# Patient Record
Sex: Female | Born: 1955 | Race: White | Hispanic: No | Marital: Single | Smoking: Never smoker
Health system: Southern US, Community
[De-identification: ages and names within clinical notes are randomized; demographics above are authoritative.]

## PROBLEM LIST (undated history)

## (undated) DIAGNOSIS — Z515 Encounter for palliative care: Secondary | ICD-10-CM

## (undated) DIAGNOSIS — G909 Disorder of the autonomic nervous system, unspecified: Secondary | ICD-10-CM

## (undated) DIAGNOSIS — G20 Parkinson's disease: Secondary | ICD-10-CM

## (undated) DIAGNOSIS — G239 Degenerative disease of basal ganglia, unspecified: Principal | ICD-10-CM

## (undated) DIAGNOSIS — G903 Multi-system degeneration of the autonomic nervous system: Secondary | ICD-10-CM

## (undated) DIAGNOSIS — L8990 Pressure ulcer of unspecified site, unspecified stage: Secondary | ICD-10-CM

## (undated) DIAGNOSIS — R339 Retention of urine, unspecified: Secondary | ICD-10-CM

## (undated) DIAGNOSIS — R0602 Shortness of breath: Secondary | ICD-10-CM

## (undated) DIAGNOSIS — M1990 Unspecified osteoarthritis, unspecified site: Secondary | ICD-10-CM

## (undated) DIAGNOSIS — G238 Other specified degenerative diseases of basal ganglia: Secondary | ICD-10-CM

## (undated) DIAGNOSIS — Z7401 Bed confinement status: Secondary | ICD-10-CM

## (undated) DIAGNOSIS — N319 Neuromuscular dysfunction of bladder, unspecified: Secondary | ICD-10-CM

## (undated) DIAGNOSIS — I951 Orthostatic hypotension: Secondary | ICD-10-CM

## (undated) DIAGNOSIS — R300 Dysuria: Secondary | ICD-10-CM

## (undated) DIAGNOSIS — R32 Unspecified urinary incontinence: Secondary | ICD-10-CM

## (undated) DIAGNOSIS — Z8639 Personal history of other endocrine, nutritional and metabolic disease: Secondary | ICD-10-CM

## (undated) HISTORY — DX: Other specified degenerative diseases of basal ganglia: G23.8

## (undated) HISTORY — PX: TONSILLECTOMY: SUR1361

## (undated) HISTORY — PX: REFRACTIVE SURGERY: SHX103

## (undated) HISTORY — DX: Unspecified osteoarthritis, unspecified site: M19.90

## (undated) HISTORY — DX: Multi-system degeneration of the autonomic nervous system: G90.3

## (undated) HISTORY — DX: Parkinson's disease: G20

## (undated) HISTORY — DX: Degenerative disease of basal ganglia, unspecified: G23.9

## (undated) HISTORY — DX: Neuromuscular dysfunction of bladder, unspecified: N31.9

## (undated) HISTORY — PX: OTHER SURGICAL HISTORY: SHX169

## (undated) HISTORY — PX: NEUROENDOSCOPIC PLACEMENT / REPLACEMENT VENTRICULAR CATHETER W/ ATTACHMENT SHUNT / EXTERNAL DRAIN: SUR890

---

## 2011-10-19 ENCOUNTER — Emergency Department (HOSPITAL_COMMUNITY): Payer: 59

## 2011-10-19 ENCOUNTER — Observation Stay (HOSPITAL_COMMUNITY)
Admission: EM | Admit: 2011-10-19 | Discharge: 2011-10-22 | DRG: 057 | Disposition: A | Discharge status: Home Health | Payer: 59 | Source: Ambulatory Visit | Attending: Internal Medicine | Admitting: Internal Medicine

## 2011-10-19 DIAGNOSIS — R1310 Dysphagia, unspecified: Secondary | ICD-10-CM | POA: Insufficient documentation

## 2011-10-19 DIAGNOSIS — R5383 Other fatigue: Secondary | ICD-10-CM | POA: Insufficient documentation

## 2011-10-19 DIAGNOSIS — G20A1 Parkinson's disease without dyskinesia, without mention of fluctuations: Secondary | ICD-10-CM | POA: Insufficient documentation

## 2011-10-19 DIAGNOSIS — G4700 Insomnia, unspecified: Secondary | ICD-10-CM | POA: Insufficient documentation

## 2011-10-19 DIAGNOSIS — Z982 Presence of cerebrospinal fluid drainage device: Secondary | ICD-10-CM | POA: Insufficient documentation

## 2011-10-19 DIAGNOSIS — I10 Essential (primary) hypertension: Secondary | ICD-10-CM | POA: Insufficient documentation

## 2011-10-19 DIAGNOSIS — G20 Parkinson's disease: Secondary | ICD-10-CM | POA: Insufficient documentation

## 2011-10-19 DIAGNOSIS — R42 Dizziness and giddiness: Secondary | ICD-10-CM | POA: Insufficient documentation

## 2011-10-19 DIAGNOSIS — K589 Irritable bowel syndrome without diarrhea: Secondary | ICD-10-CM | POA: Insufficient documentation

## 2011-10-19 DIAGNOSIS — R5381 Other malaise: Secondary | ICD-10-CM | POA: Insufficient documentation

## 2011-10-19 DIAGNOSIS — G238 Other specified degenerative diseases of basal ganglia: Principal | ICD-10-CM | POA: Insufficient documentation

## 2011-10-19 DIAGNOSIS — R262 Difficulty in walking, not elsewhere classified: Secondary | ICD-10-CM | POA: Insufficient documentation

## 2011-10-19 LAB — URINE MICROSCOPIC-ADD ON

## 2011-10-19 LAB — URINALYSIS, ROUTINE W REFLEX MICROSCOPIC
Bilirubin Urine: NEGATIVE
Leukocytes, UA: NEGATIVE
Nitrite: NEGATIVE
Specific Gravity, Urine: 1.015 (ref 1.005–1.030)
Urobilinogen, UA: 0.2 mg/dL (ref 0.0–1.0)
pH: 7 (ref 5.0–8.0)

## 2011-10-19 LAB — COMPREHENSIVE METABOLIC PANEL
BUN: 26 mg/dL — ABNORMAL HIGH (ref 6–23)
CO2: 30 mEq/L (ref 19–32)
Calcium: 9.5 mg/dL (ref 8.4–10.5)
Chloride: 105 mEq/L (ref 96–112)
Creatinine, Ser: 0.8 mg/dL (ref 0.50–1.10)
GFR calc Af Amer: >90 mL/min (ref >90)
GFR calc non Af Amer: 80 mL/min — ABNORMAL LOW (ref >90)
Glucose, Bld: 130 mg/dL — ABNORMAL HIGH (ref 70–99)
Potassium: 3.9 mEq/L (ref 3.5–5.1)
Sodium: 143 mEq/L (ref 135–145)
Total Protein: 6.5 g/dL (ref 6.0–8.3)

## 2011-10-19 LAB — CBC
HCT: 42.8 % (ref 36.0–46.0)
Platelets: 164 10*3/uL (ref 150–400)
RDW: 12.5 % (ref 11.5–15.5)
WBC: 7.2 10*3/uL (ref 4.0–10.5)

## 2011-10-19 LAB — DIFFERENTIAL
Basophils Absolute: 0 10*3/uL (ref 0.0–0.1)
Eosinophils Absolute: 0.1 10*3/uL (ref 0.0–0.7)
Eosinophils Relative: 2 % (ref 0–5)
Lymphocytes Relative: 17 % (ref 12–46)

## 2011-10-20 ENCOUNTER — Encounter: Payer: Self-pay | Admitting: Internal Medicine

## 2011-10-20 DIAGNOSIS — R269 Unspecified abnormalities of gait and mobility: Secondary | ICD-10-CM

## 2011-10-20 DIAGNOSIS — G238 Other specified degenerative diseases of basal ganglia: Secondary | ICD-10-CM

## 2011-10-20 LAB — URINE CULTURE: Culture  Setup Time: 201210250130

## 2011-10-20 NOTE — H&P (Signed)
Hospital Admission Note Date: 10/20/2011  Patient name: [name] Medical record number: [id number] Date of birth: [birth year] Age: 55 y.o. Gender: female PCP: Recently moved from [place], scheduled to see Dr. Glena Norfolk as PCP; Scheduled to attend Movement Disorder clinic at [facility] in December.  Medical Service: Leitha Bleak Teaching Service  Attending physician: Dr. Cliffton Asters    1st Contact: Dr. Margorie John  Pager: [phone] 2nd Contact: Dr. Johnette Abraham Pager: [phone] After 5 pm or weekends: 1st Contact:      Pager: [phone] 2nd Contact:      Pager: [phone]  Chief Complaint: Difficulty walking, dizziness, confusion  History of Present Illness: 55 yo woman with PMH of Multiple system atrophy that presents with 1-2 days of worsening symptoms of gait instability, confusion & dizziness.  She describes dizziness as both feeling like going to pass out & as feeling out of equilibrium with balance disturbances, and notes that she experiences the latter more frequently.  She notes that her symptoms worsens when she is tired.  She describes confusion as not knowing how to do something she previously knew how to do.  The first occurrence of confusion was a few weeks ago in [place] (from where she recently moved) when she couldn't remember how to change clothes.  During this first effect, she notes having missed a dose of fludrocortisone.  On the day prior to admission, she could not "tell my feet to walk to the bathroom".  She denies visual or auditory hallucinations.  She notes being hypotensive on the morning of admission, but does not know exactly what her pressure was.  Over the last 1-2 days she has had more difficulty with walking, which she describes as increased rigidity, and she uses a walker at baseline.  She reports blurry vision occasionally, but this has been occuring since December.  She reports sinus congestion with dry cough for the last few days.  She has not been around any  sick contacts recently and denies fever/chills.  Finally, she reports worsening dysphagia over the last few months, for which she was to see a Doctor, general practice before moving to [place].  She notes that she has difficulty swalling her glucosamine tablet, so has discontinued this medication.  She reports that bread/crackers gets stuck in her throat, which she is able to wash down with fluid.  She reports that she underwent a barium study in [place] 6 weeks ago that suggested that her swallowing reflex is not timed appropriately, and she can swallow more effectively when she bends her neck.   Meds: Carbidopa-Levodopa Fludrocortisone, one dose every other day Fosamax Lorazepam (pt reports she has this medication, but does not take)   Allergies: Ibuprofen (facial swelling), Sulfa (unknown rxn, happened when she was a baby)  Past Medical History : Multiple System Atrophy (Shy-Drager Syndrome), Osteoperosis, arthritis (b/l knees) Briefly, her neurologically related problems began about 3 years ago, as she began having problems with her blood pressure, IBS, & urinary incontinence.  About 1.5 years ago, her symptoms worsened and she was placed on fludrocortisone for hypotension.  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  The cyst was removed, her symptoms briefly improved, and then worsened again.  Fluid accumulation was found in the empty space from cyst removal, and she then had a shunt placed in May 2012.  Symptoms again improved then worsened.  She was recently diagnosed with Multiple System  Atrophy in [place], [place] by Dr. Rhetta Mura.  She was also being managed by cardiology (Dr. Westley Foots, [facility], [place]).  Past Surgical History: Brain cyst removal (December 2011), Tonsillectomy, Lasix Eye surgery  Family History:  Father - HIV, HBV, CAD, died after 5-vessel bypass surgery @ 65 yo Mother  - CAD, died of MI @ 71 yo Brother - HLD  Social History  . Marital Status: Single   Occupational History  . 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 Smoking status: None  . Alcohol Use: Occasional wine  . Drug Use: None   Other Topics Concern  . Moved from [place] 1 week ago (10/11/11) to live with brother given worsening health   Review of Systems: General: no fevers, chills, changes in weight, changes in appetite Skin: no rash HEENT: no hearing changes, sore throat Pulm: no dyspnea, wheezing CV: no chest pain, palpitations, shortness of breath Abd: no abdominal pain, nausea/vomiting, +constipation (patient has history of IBS 2/2 MSA) GU: no dysuria, hematuria, polyuria, + urinary incontinence (urinates when she rises from sitting) Ext: no new arthralgias, myalgias Neuro: no weakness, numbness, or tingling  Physical Exam: Vitals: T:98.6   R:16    BP: 181/106    RR: 16    O2 saturation: 100% RA General: resting in bed, no acute distress HEENT: PERRL, EOMI, no scleral icterus, no conjunctival pallor Cardiac: RRR, no rubs, murmurs or gallops, normal S1, S2 Pulm: clear to auscultation bilaterally, moving normal volumes of air, no wheezing/rales/rhonchi Abd: soft, nontender, nondistended, BS normoactive Ext: warm and well perfused, no pedal edema, bradykineseic rigidity of all 4 limps Neuro: alert and oriented X3, cranial nerves II-XII grossly intact, strength (3/5 in all extremities) and sensation to light touch equal in bilateral upper and lower extremities, unable to assess gait as patient think she cannot walk today, but may be able to after some more rest  Lab results: Basic Metabolic Panel:  Basename 10/19/11 1739  NA 143  K 3.9  CL 105  CO2 30  GLUCOSE 130*  BUN 26*  CREATININE 0.80  CALCIUM 9.5  MG --  PHOS --   Liver Function Tests:  Basename 10/19/11 1739  AST 17  ALT 8  ALKPHOS 62  BILITOT 0.7    PROT 6.5  ALBUMIN 4.2   CBC:  Basename 10/19/11 1739  WBC 7.2  NEUTROABS 5.3  HGB 14.3  HCT 42.8  MCV 89.9  PLT 164   Imaging results:  10/19/2011  CHEST - 2 VIEW:  Comparison: None.   Findings: Cardiac silhouette normal in size.  Thoracic aorta minimally atherosclerotic.  Hilar and mediastinal contours otherwise.  Mild hyperinflation.  Mildly prominent bronchovascular markings diffusely and mild central peribronchial thickening. Lungs otherwise clear.  No pleural effusions.  Visualized bony thorax intact.  Apparent nodular opacities in the lung bases on the PA image represent the nipples.   IMPRESSION: Mild changes of bronchitis and/or asthma which may be acute or chronic.  No acute cardiopulmonary disease otherwise.     Assessment & Plan by Problem: Patient is a 55 yo woman with PMH significant for MSA that presents with  #Unsteady gait: Patient has had extensive work up in the past, and in the last month has been diagnosed with Multiple System Atrophy, which is characterized by autonomic dysfunction, parkinsonism, & ataxia. As the disease progresses, autonomic symptoms that patients may develop include orthostatic hypotension, urinary incontinence, constipation, vocal cord  paralysis, dry mouth & skin, trouble regulating body temperature, abnormal breathing & sleep disorders.  Ms. Tortora seems to already be suffering with several of these symptoms.  I suspect her baseline symptoms are being exacerbated by exhaustion from her recent move to  as well as possibly a mild sinus infection or allergies.  Given her history of neurosurgery, the possibility that some sort of mass effect may be contributing to symptoms cannot be ruled out. Plan:  -Continue Carbidopa-Levodopa once dose clarified -Follow up with Neurology recommendations  -Follow up MRI results -Retreive records from [place] -PT/OT consult  #Hypertension : Patient reports labile pressures, many times being hypotensive.  Likely  2/2 above reason. Plan: -Hold fludrocortisone until dose clarified, also to monitor BP -Orthostatic Vital signs  #Dysphagia: Problem has been persisting for the last few months and has been worked up in [place].  Likely 2/2 first problem. Plan: -Consult speech therapy -Obtain medical records for results from barium speech swallow in [place]  #VTE Prophylaxis: Lovenox 40mg  Watkins daily  R3______________________________ (Dr. Nelda Bucks, [phone])   R1________________________________ (Dr. Vernice Jefferson, [phone])  ATTENDING: I performed and/or observed a history and physical examination of the patient.  I discussed the case with the residents as noted and reviewed the residents' notes.  I agree with the findings and plan--please refer to the attending physician note for more details.  Signature________________________________  Printed Name_____________________________

## 2011-10-21 ENCOUNTER — Inpatient Hospital Stay (HOSPITAL_COMMUNITY): Payer: 59

## 2011-10-22 DIAGNOSIS — G238 Other specified degenerative diseases of basal ganglia: Secondary | ICD-10-CM

## 2011-10-22 DIAGNOSIS — R269 Unspecified abnormalities of gait and mobility: Secondary | ICD-10-CM

## 2011-10-26 NOTE — Consult Note (Signed)
[name], [name]                  ACCOUNT NO.:  [id number]  MEDICAL RECORD NO.:  [id number]  LOCATION:  [facility]                         FACILITY:  [facility]  PHYSICIAN:  Joycelyn Schmid, MD   DATE OF BIRTH:  1956/10/24  DATE OF CONSULTATION:  10/20/2011 DATE OF DISCHARGE:                                CONSULTATION   CHIEF COMPLAINT:  Gait difficulty, freezing, low blood pressure.  HISTORY OF PRESENT ILLNESS:  A 55 year old female with recent diagnosis of multiple system atrophy, type A, who is living in [place], moved to [place] 1 week ago.  She lives with her brother, has been having at least 1 month progressive decline in mobility, gait, balance, and progressive dizziness.  The patient came to the hospital due to inability to ambulate at home and extreme dizziness.  Upon arrival, the patient was found to have hypotension with systolic blood pressure in the 50s which responded to IV fluids and positioning.  The patient's symptoms date back to at least 2010, when she initially had irritable bowel syndrome, incontinence, and intermittent episodes of low blood pressure.  Ultimately, she was found to have a posterior fossa cyst which was operated on in December 2011.  Subsequently, the patient developed a membrane and this was operated on again and intracranial shunt was placed in May 2012.  Her symptoms failed to improve. Ultimately, she saw a neurologist in [place] who diagnosed her with Parkinson-plus syndrome, multiple system atrophy type A.  She was started on carbidopa/ levodopa and fludrocortisone.  MEDICATIONS: 1. Carbidopa/levodopa 1 tablet 3 times a day, she is not sure of the     milligram dose. 2. Fludrocortisone 0.1 mg every other day. 3. Fosamax. 4. Aspirin 81 mg daily.  ALLERGIES:  IBUPROFEN and SULFA.  PAST MEDICAL HISTORY:  As per the HPI for Parkinson plus syndrome and osteopenia.  FAMILY HISTORY:  Paternal grand mother's family has number of  family members with Parkinson disease.  SOCIAL HISTORY:  She just moved to [place] to live with her brother.  She has appointment scheduled with [facility] in December.  She has occasional tobacco and alcohol use.  No illicit drug use.  REVIEW OF SYSTEMS:  As per the HPI.  Noted for dizziness, gait difficulty, freezing.  No chest pain, shortness of breath, muscle pains, nausea, vomiting.  She does have irritable bowel syndrome symptoms of incontinence.  Otherwise, review of systems are negative.  PHYSICAL EXAMINATION:  VITAL SIGNS:  Temperature 98.6, initial blood pressure 50/40, then 181/106, and then 91/40, heart rate 80s, respirations 16, O2 saturation 100% on room air. GENERAL:  She is awake and alert.  Language is fluent.  Comprehension intact.  She has a flat affect with hypomania.  She has a positive Myerson sign. NEUROLOGIC:  Cranial nerve examination:  Pupils reactive from 2 to 1 mm. Extraocular muscles intact.  Visual fields full to confrontation. Facial sensation and strength are symmetric.  Uvula is midline. Shoulder is symmetric.  Tongue is midline.  Motor examination: Increased tone in the bilateral upper and lower extremities.  Marked bradykinesia in the bilateral upper and lower extremities.  No resting tremor.  No postural tremor.  Otherwise, she has full strength.  Sensory examination:  Intact to light touch, temperature, vibration.  Reflexes: 2+ in the upper and lower extremities.  Downgoing toes.  Coordination testing:  Finger-nose-finger is smooth, but slow.  CARDIOVASCULAR: Regular rate and rhythm.  No murmurs.  No carotid bruits.  LAB TESTING:  Sodium 143, potassium 3.9, BUN 26, creatinine 0.8, glucose 130.  LFTs normal.  White count 7.2, platelets 164.  UA is negative for UTI.  MRI scan of the brain and cervical spine which I reviewed shows no acute findings.  Postsurgical changes noted in the posterior fossa  from prior cyst resection, posterior fossa shunt also noted.  MRI of the cervical spine shows multilevel degenerative disk disease.  ASSESSMENT AND PLAN:  A 55 year old female with multiple system atrophy type A, now with progression of symptoms.  RECOMMENDATIONS: 1. Gradually increase carbidopa/levodopa dosing to 4 tablets per day     for several days, then 5 tablets per day, and then 6 tablets per     day, goal dosing should be 2 tablets 3 times a day.  For example,     the patient can start taking 2 tablets in the morning, 1 at noon,     and 1 in the evening for 3-5 days, and then increase to 2 in the     morning, 2 at noon, 1 at night for 3-5 days, and then 2 tablets 3     times a day. 2. Continue fludrocortisone.  May need to monitor and make adjustments     depending on her vital signs and her orthostatics tolerance. 3. Physical Therapy evaluation.     Joycelyn Schmid, MD     VP/MEDQ  D:  10/20/2011  T:  10/20/2011  Job:  [id number]  Electronically Signed by Joycelyn Schmid  on 10/26/2011 09:53:28 AM

## 2011-11-02 NOTE — Discharge Summary (Signed)
[name], [name]                  ACCOUNT NO.:  [id number]  MEDICAL RECORD NO.:  [id number]  LOCATION:  [facility]                         FACILITY:  [facility]  PHYSICIAN:  Cliffton Asters, M.D.    DATE OF BIRTH:  1956/09/02  DATE OF ADMISSION:  10/19/2011 DATE OF DISCHARGE:  10/22/2011                              DISCHARGE SUMMARY   DISCHARGE DIAGNOSES: 1. Bradykinesia and rigidity.  The patient has a diagnosis of     Multiple system atrophy which is a Parkinson plus syndrome.  She is     treated at home with Sinemet.  She reports mild improvement in her     rigidity and the strength of her voice with start of Sinemet prior     To admission. 2. Dizziness.  The patient notes dizziness and unsteadiness that have     been a chronic problem for her that are worsened recently.  She is     Managed with fludrocortisone for her orthostatic hypotension.  Her dose     of fludrocortisone had recently been changed by her cardiologist in     [place] so that she was getting it every other day instead of each     day.  It is possible that this contributed to the worsening of her     dizziness. 3. Hypertension.  The patient has been noted to have hypertension     while lying down in the past.  During her hospital stay, she was     not hypertensive. 4. Dysphagia.  The patient reports having history of feeling     subjectively like she is choking on foods at times in the past.      This has been worked up with a barium swallow study in [place]. 5. Insomnia.  The patient reports that she often wakes up as many as 4-     7 times per night to go to the bathroom to urinate. Each trip to     the bathroom takes her more than half an hour, so she has a     significant disturbance in her sleep. 6. Irritable bowel syndrome.  The patient has alternating episodes of          constipation and diarrhea chronically.  She manages this with loperamide and     MiraLax as needed.  DISCHARGE MEDICATIONS: 1. Sinemet 25  mg/100 mg special instructions, please take 2 pills in     the morning, 1 pill at noon and one pill in the evening for 4 days,     then after those 4 days, take 2 pills in the morning, 2 pills at     noon and 1 pill in the evening for 5 days, then continue to take 2     tablets 3 times a day, morning, noon and evening times by mouth. 2. Alendronate 70 mg one tablet by mouth weekly. 3. Aspirin 81 mg one tablet by mouth daily. 4. Fish oil one tablet by mouth daily. 5. Fludrocortisone 0.1 mg one tablet by mouth every other day. 6. Imodium ultra one tablet by mouth every 4 hours as needed for     diarrhea. 7.  Lorazepam 0.5 mg one tablet by mouth daily as needed. 8. Multivitamin one tablet by mouth daily. 9. Calcium carbonate one tablet by mouth daily. 10.Probiotic one tablet by mouth daily.  DISPOSITION AND FOLLOWUP:  The patient has appointment at the [facility] on December 08, 2011, at 8 a.m.  at this time, please evaluate the patient's rigidity and bradykinesia and adjust her dose of Sinemet as you see appropriate.  She also has appointment with Dr. Anda Latina on November 08, 2011, at 2 p.m.  Please continue to manage the patient's hypertension while sitting or laying down and hypotension while standing. This has been an issue for the patient which requires a balance of her dose of fludrocortisone. She also has an appointment with Dr. Sandria Manly, her neurologist.  The office is to call the patient to make an appointment. We would appreciate any recommendations you have in treatment of her ongoing multisystem atrophy.  PROCEDURES PERFORMED:  Chest x-ray, PA and lateral which was read as mild changes of bronchitis and/or asthma which may be acute or chronic. No acute cardiopulmonary disease, otherwise an MRI of the cervical spine without contrast which was read as no acute infarct and an MRI of the brain without contrast which was also read as no acute infarct.  CONSULTATIONS:   Physical therapy, Occupational therapy, Neurology, Speech therapy.  BRIEF HISTORY AND PHYSICAL:  [name] is a 55 year old woman with a past medical history of multisystem atrophy who presents with 1-2 days of worsening symptoms of gait instability, confusion, and dizziness.  She notes her symptoms worsen when she is tired.  She also notes confusion which is described as not knowing how to do something she previously  knew how to do. On the day prior to admission, she could not get her feet to move her towards the bathroom and she also has had more difficulty with walking for 1-2 days which she describes as increased rigidity.  She does use a walker at baseline in the home and a wheelchair when traveling out of the home.  She also reports worsening dysphagia over the last few months and she has noted some difficulty swallowing her glucosamine tablet and has had bread and crackers get suck in her throat occasionally which she is able to wash down with fluid.  She does report having undergone a barium swallow study in [place] few weeks ago that suggested that her swallowing reflux is not appropriately timed and that she can swallow more effectively when she bends her neck while swallowing.  PHYSICAL EXAMINATION:  VITAL SIGNS:  Temperature 98.6, heart rate 80, blood pressure 181/106, respiratory rate 16, and oxygen saturation 100% on room air. GENERAL:  She was a pleasant woman, resting in bed, in no acute distress. HEENT:  Her pupils were equal and reactive to light and her extraocular muscles were intact.  No scleral icterus.  No conjunctival pallor. CARDIAC:  Regular rate and rhythm.  No rubs, murmurs, or gallops. Normal S1, S2. PULMONARY:  Clear to auscultation bilaterally.  Moving normal volumes of air.  No wheezing, rales, or rhonchi. ABDOMEN:  Soft, nontender, nondistended.  Bowel sounds normoactive. EXTREMITIES:  Warm and well perfused.  No pedal edema.  There  was bradykinesia and rigidity of all 4 limbs. NEUROLOGICAL:  Alert and oriented x3.  Cranial nerves II through XII grossly intact.  Strength is 3/5 in all extremities and sensation to light touch is equal in bilateral upper and lower extremities.  Unable to assess gait as  the patient thinks she cannot walk currently.  LAB RESULTS:  Upon admission, CBC, white blood cell 7.2, hemoglobin 14.3, hematocrit 42.8, and platelets 164.  Basic metabolic panel, sodium 143, potassium 3.9, chloride 105, bicarb 30, BUN 26, creatinine 0.8, calcium 9.5, and glucose 130.  HOSPITAL COURSE BY PROBLEM: 1. Bradykinesia and rigidity.  The patient's Sinemet was increased to     4 times a day and she was put on a discharge schedule to increase     the medicine to a total of 6 times a day over the course of 9 days.     Her rigidity improved subjectively, so that she was able to go to     the bathroom by herself.   DICTATION ENDED AT THIS POINT.    ______________________________ Margorie John, MD   ______________________________ Cliffton Asters, M.D.    ST/MEDQ  D:  11/01/2011  T:  11/01/2011  Job:  [id number]  cc:   [facility] Dr. Gwen Pounds. Genene Churn. Love, M.D.

## 2011-11-02 NOTE — Discharge Summary (Signed)
[name], [name]                  ACCOUNT NO.:  [id number]  MEDICAL RECORD NO.:  [id number]  LOCATION:  [facility]                         FACILITY:  [facility]  PHYSICIAN:  Margorie John, MD       DATE OF BIRTH:  Aug 31, 1956  DATE OF ADMISSION:  10/19/2011 DATE OF DISCHARGE:                              DISCHARGE SUMMARY   ADDENDUM  HOSPITAL COURSE BY PROBLEM: 1. Bradykinesia/rigidity.  The patient's dose of Sinemet was increased to     4 times a day.  She reports subjective improvement in her rigidity     during her hospital stay.  This was possibly due to getting more     rest since she has reported that her symptoms of rigidity are worse     when she is feeling tired.  She said she was improved to the point     where she was able to make a trip back and forth to the bathroom,     which was an improvement from her initially needing to use the     bedpan.  She felt essentially back at her baseline. 2. Dizziness.  The patient reports that her dizziness is much     improved.  We did not make any changes to her fludrocortisone     dosing, and she had this improvement without any changes in her     dosing.  Her blood pressure remain well controlled and she did not     become hypertensive. 3. Blood pressure instability.  The patient did not have any episodes     of hypotension during her hospital stay while standing up nor did she have     hypertension while sitting or laying down. 4. Insomnia.  The patient reported no trouble falling asleep in the     hospital and woke only one time overnight to go to the bathroom     which is improvement from her baseline.  She reports that when she     is on higher dose of fludrocortisone, she finds that when she lays     down, she has more retained fluid which makes her take more trips to     the bathroom to void. 5. Irritable bowel syndrome.  The patient did not report having any     episodes of constipation nor diarrhea while in-house. We did not  make any changes to her regimen of MiraLax and Imodium as needed     for constipation or diarrhea.  DISCHARGE LABS AND VITAL SIGNS:  No labs were drawn on the day of discharge.  Vital signs, temperature 97.7, pulse 70, systolic blood pressure 134, diastolic blood pressure 90, respirations 18 per minute, and oxygen saturation 96% on room air.          ______________________________ Margorie John, MD     ST/MEDQ  D:  11/01/2011  T:  11/01/2011  Job:  [id number]

## 2011-11-04 ENCOUNTER — Telehealth: Payer: Self-pay | Admitting: *Deleted

## 2011-11-04 NOTE — Telephone Encounter (Signed)
I have no knowledge of this patien;t who is PCP??

## 2011-11-04 NOTE — Telephone Encounter (Signed)
Pt went home from hospital with orthostatic hypotension.  Lying 94/74, sitting 100/74, a standing with  no reading, which is not unusual.  Asymptomatic and using 02 and PT.  Any other orders?

## 2011-11-07 NOTE — Telephone Encounter (Signed)
Talked to [facility], and they do have this pt, but are confused on where she came from.  Will call us back.

## 2011-11-08 ENCOUNTER — Ambulatory Visit (INDEPENDENT_AMBULATORY_CARE_PROVIDER_SITE_OTHER): Payer: 59 | Admitting: Internal Medicine

## 2011-11-08 ENCOUNTER — Encounter: Payer: Self-pay | Admitting: Internal Medicine

## 2011-11-08 DIAGNOSIS — M810 Age-related osteoporosis without current pathological fracture: Secondary | ICD-10-CM | POA: Insufficient documentation

## 2011-11-08 DIAGNOSIS — G238 Other specified degenerative diseases of basal ganglia: Secondary | ICD-10-CM

## 2011-11-08 NOTE — Patient Instructions (Signed)
Return in one month for follow-up  Neurology followup as discussed

## 2011-11-08 NOTE — Progress Notes (Signed)
Subjective:    Patient ID: [name], female    DOB: Mar 24, 1956, 55 y.o.   MRN: [id number]  HPI  55 year old patient who is seen today to establish with our practice. 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 She is now followed at the [facility] neurology outpatient movement disorder clinic. Her chief problem is orthostatic hypotension at the present time she is on Florinef 0.1 mg every other day as well as midodrine 5 mg twice daily. She continues to have a labile readings. She has been given detailed instructions for nondrug treatment of low blood pressure on standing from the [facility] neurology department. She has been followed by Dr. Rubin Payor who plans on following the patient only by annually. The patient wishes to followup also with a local neurologist.  She was hospitalized recently due to the worsening symptoms of gait instability confusion and orthostatic dizziness. At the present time she feels reasonably stable. She is ambulatory within the home with a walker.       Review of Systems  Constitutional: Negative for fever, appetite change, fatigue and unexpected weight change.  HENT: Negative for hearing loss, ear pain, nosebleeds, congestion, sore throat, mouth sores, trouble swallowing, neck stiffness, dental problem, voice change, sinus pressure and tinnitus.   Eyes: Negative for photophobia, pain, redness and visual disturbance.  Respiratory: Negative for cough, chest tightness and shortness of breath.   Cardiovascular: Negative for chest pain, palpitations and leg swelling.  Gastrointestinal: Negative for nausea, vomiting, abdominal pain, diarrhea, constipation, blood in stool, abdominal distention and rectal pain.  Genitourinary: Positive for difficulty urinating (urinary incontinence). Negative for dysuria, urgency, frequency, hematuria, flank pain, vaginal bleeding, vaginal discharge,  genital sores, vaginal pain, menstrual problem and pelvic pain.  Musculoskeletal: Negative for back pain and arthralgias.  Skin: Negative for rash.  Neurological: Positive for dizziness, syncope, weakness and light-headedness. Negative for speech difficulty, numbness and headaches.  Hematological: Negative for adenopathy. Does not bruise/bleed easily.  Psychiatric/Behavioral: Negative for suicidal ideas, behavioral problems, self-injury, dysphoric mood and agitation. The patient is not nervous/anxious.        Objective:   Physical Exam  Constitutional: She is oriented to person, place, and time. She appears well-developed and well-nourished.       Blood pressure 102/68 on arrival sitting Blood pressure repeat 94/64 sitting  Standing blood pressure not attempted  HENT:  Head: Normocephalic.  Right Ear: External ear normal.  Left Ear: External ear normal.  Mouth/Throat: Oropharynx is clear and moist.  Eyes: Conjunctivae and EOM are normal. Pupils are equal, round, and reactive to light.  Neck: Normal range of motion. Neck supple. No thyromegaly present.  Cardiovascular: Normal rate, regular rhythm, normal heart sounds and intact distal pulses.   Pulmonary/Chest: Effort normal and breath sounds normal.  Abdominal: Soft. Bowel sounds are normal. She exhibits no mass. There is no tenderness.  Musculoskeletal: Normal range of motion.       Support hose in place  Lymphadenopathy:    She has no cervical adenopathy.  Neurological: She is alert and oriented to person, place, and time.  Skin: Skin is warm and dry. No rash noted.  Psychiatric: She has a normal mood and affect. Her behavior is normal.          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 She goes to avoid symptomatic orthostatic hypotension.  Patient is aware that blood pressure readings that time in a supine position may run  high. Her nondrug treatment for low blood pressure on standing review. She will followup at the [facility] and also be set up to see a local neurologist. Hospital records will be reviewed Return next month for followup

## 2011-11-11 ENCOUNTER — Telehealth: Payer: Self-pay | Admitting: Internal Medicine

## 2011-11-11 NOTE — Telephone Encounter (Signed)
ok 

## 2011-11-11 NOTE — Telephone Encounter (Signed)
Pt is a new DR[facility] Bev[facility] Ngo from [facility] would like her to keep up with her physical therapy once a week for the next 5 weeks. Any question please contact Olegario Messier

## 2011-11-11 NOTE — Telephone Encounter (Signed)
Kim from [facility] would like additional occupational  therapy sessions for this pt, once  a wk for next 2 wk. Please call back with verbal orders.

## 2011-11-14 NOTE — Telephone Encounter (Signed)
Attempt to call - VM - LMTCB if questions - ok to continue PT

## 2011-11-14 NOTE — Telephone Encounter (Signed)
[name] is aware occupational therapy has been approved

## 2011-12-08 ENCOUNTER — Encounter: Payer: Self-pay | Admitting: Internal Medicine

## 2011-12-08 ENCOUNTER — Ambulatory Visit (INDEPENDENT_AMBULATORY_CARE_PROVIDER_SITE_OTHER): Payer: 59 | Admitting: Internal Medicine

## 2011-12-08 DIAGNOSIS — G238 Other specified degenerative diseases of basal ganglia: Secondary | ICD-10-CM

## 2011-12-08 DIAGNOSIS — R350 Frequency of micturition: Secondary | ICD-10-CM

## 2011-12-08 DIAGNOSIS — M810 Age-related osteoporosis without current pathological fracture: Secondary | ICD-10-CM

## 2011-12-08 LAB — POCT URINALYSIS DIPSTICK
Bilirubin, UA: NEGATIVE
Leukocytes, UA: NEGATIVE
Nitrite, UA: NEGATIVE
pH, UA: 6

## 2011-12-08 MED ORDER — FLUDROCORTISONE ACETATE 0.1 MG PO TABS: ORAL_TABLET | ORAL | Status: DC

## 2011-12-08 NOTE — Patient Instructions (Signed)
Increase Florinef to daily Liberalize  Your  fluid and salt intake  Return in 3 months

## 2011-12-08 NOTE — Progress Notes (Signed)
  Subjective:    Patient ID: [name], female    DOB: 1956-10-02, 55 y.o.   MRN: [id number]  HPI  55 year old patient who has a history of multisystem atrophy/parkinsonism plus syndrome with significant orthostatic hypotension. She remains fairly symptomatic with the hypotension but has had no frank syncope. There has been only modest peripheral edema and very little supine hypertension. In general doing quite well    Review of Systems  Constitutional: Positive for fatigue.  HENT: Negative for hearing loss, congestion, sore throat, rhinorrhea, dental problem, sinus pressure and tinnitus.   Eyes: Negative for pain, discharge and visual disturbance.  Respiratory: Negative for cough and shortness of breath.   Cardiovascular: Negative for chest pain, palpitations and leg swelling.  Gastrointestinal: Negative for nausea, vomiting, abdominal pain, diarrhea, constipation, blood in stool and abdominal distention.  Genitourinary: Negative for dysuria, urgency, frequency, hematuria, flank pain, vaginal bleeding, vaginal discharge, difficulty urinating, vaginal pain and pelvic pain.  Musculoskeletal: Negative for joint swelling, arthralgias and gait problem.  Skin: Negative for rash.  Neurological: Positive for weakness and light-headedness. Negative for dizziness, syncope, speech difficulty, numbness and headaches.  Hematological: Negative for adenopathy.  Psychiatric/Behavioral: Negative for behavioral problems, dysphoric mood and agitation. The patient is not nervous/anxious.        Objective:   Physical Exam  Constitutional: She is oriented to person, place, and time. She appears well-developed and well-nourished.       Blood pressure sitting 80/60  HENT:  Head: Normocephalic.  Right Ear: External ear normal.  Left Ear: External ear normal.  Mouth/Throat: Oropharynx is clear and moist.  Eyes: Conjunctivae and EOM are normal. Pupils are equal, round, and reactive to light.  Neck: Normal  range of motion. Neck supple. No thyromegaly present.  Cardiovascular: Normal rate, regular rhythm, normal heart sounds and intact distal pulses.   Pulmonary/Chest: Effort normal and breath sounds normal.  Abdominal: Soft. Bowel sounds are normal. She exhibits no mass. There is no tenderness.  Musculoskeletal: Normal range of motion.  Lymphadenopathy:    She has no cervical adenopathy.  Neurological: She is alert and oriented to person, place, and time.  Skin: Skin is warm and dry. No rash noted.  Psychiatric: She has a normal mood and affect. Her behavior is normal.          Assessment & Plan:   Multisystem atrophy with orthostatic hypotension. We'll uptitrate Florinef to 0.1 mg daily Medical regimen otherwise unchanged Recheck 3 months Followup [facility] in the spring

## 2011-12-30 ENCOUNTER — Telehealth: Payer: Self-pay | Admitting: Internal Medicine

## 2011-12-30 MED ORDER — ALENDRONATE SODIUM 70 MG PO TABS: 70.0000 mg | ORAL_TABLET | ORAL | Status: DC

## 2011-12-30 NOTE — Telephone Encounter (Signed)
Generic 70 mg  # 12  rf 6

## 2011-12-30 NOTE — Telephone Encounter (Signed)
Needs new rx for Fosamax sent to [facility]. Was previously filled by another md. Thanks.

## 2011-12-30 NOTE — Telephone Encounter (Signed)
rx sent to pharmacy

## 2012-03-08 ENCOUNTER — Encounter: Payer: Self-pay | Admitting: Internal Medicine

## 2012-03-08 ENCOUNTER — Ambulatory Visit (INDEPENDENT_AMBULATORY_CARE_PROVIDER_SITE_OTHER): Payer: 59 | Admitting: Internal Medicine

## 2012-03-08 VITALS — BP 114/80

## 2012-03-08 DIAGNOSIS — G238 Other specified degenerative diseases of basal ganglia: Secondary | ICD-10-CM

## 2012-03-08 NOTE — Progress Notes (Signed)
  Subjective:    Patient ID: [name], female    DOB: 03/26/1956, 56 y.o.   MRN: [id number]  HPI  56 year old patient who has a history of multi-system atrophy and orthostatic hypotension. She has done fairly well over the past 3 months she spends most of her time in a wheelchair but does have some ability to ambulate with a 4. Walker.  There have been no syncopal episodes or falls since her last visit. Her fewer math has been titrated to a 0.1 mg tablet alternating with one half tablet every other day. She also remains on midodrine    Review of Systems  HENT: Negative for hearing loss, congestion, sore throat, rhinorrhea, dental problem, sinus pressure and tinnitus.   Eyes: Negative for pain, discharge and visual disturbance.  Respiratory: Negative for cough and shortness of breath.   Cardiovascular: Negative for chest pain, palpitations and leg swelling.  Gastrointestinal: Negative for nausea, vomiting, abdominal pain, diarrhea, constipation, blood in stool and abdominal distention.  Genitourinary: Negative for dysuria, urgency, frequency, hematuria, flank pain, vaginal bleeding, vaginal discharge, difficulty urinating, vaginal pain and pelvic pain.  Musculoskeletal: Positive for gait problem. Negative for joint swelling and arthralgias.  Skin: Negative for rash.  Neurological: Positive for weakness. Negative for dizziness, syncope, speech difficulty, numbness and headaches.  Hematological: Negative for adenopathy.  Psychiatric/Behavioral: Negative for behavioral problems, dysphoric mood and agitation. The patient is not nervous/anxious.        Objective:   Physical Exam  Constitutional: She is oriented to person, place, and time. She appears well-developed and well-nourished. No distress.       Alert Wheelchair bound Blood pressure 160/90 sitting 140/80 standing    HENT:  Head: Normocephalic.  Right Ear: External ear normal.  Left Ear: External ear normal.  Mouth/Throat:  Oropharynx is clear and moist.  Eyes: Conjunctivae and EOM are normal. Pupils are equal, round, and reactive to light.  Neck: Normal range of motion. Neck supple. No thyromegaly present.  Cardiovascular: Normal rate, regular rhythm, normal heart sounds and intact distal pulses.   Pulmonary/Chest: Effort normal and breath sounds normal.  Abdominal: Soft. Bowel sounds are normal. She exhibits no mass. There is no tenderness.  Musculoskeletal: Normal range of motion. She exhibits no edema.  Lymphadenopathy:    She has no cervical adenopathy.  Neurological: She is alert and oriented to person, place, and time.  Skin: Skin is warm and dry. No rash noted.  Psychiatric: She has a normal mood and affect. Her behavior is normal.          Assessment & Plan:   Orthostatic hypotension. Fairly well-controlled at the present time we'll continue present regimen Have encouraged more exercise. She has had physical therapy in the past and has been encouraged to Exercise more.  Followup [facility] movement disorder in 2 months Recheck here in 3 or 4 months

## 2012-03-08 NOTE — Patient Instructions (Signed)
Recommend a more regular exercise regimen  Followup at [facility] movement disorder clinic in 2 months  Recheck here in 3-4 months

## 2012-05-21 ENCOUNTER — Emergency Department (HOSPITAL_COMMUNITY): Payer: 59

## 2012-05-21 ENCOUNTER — Emergency Department (HOSPITAL_COMMUNITY)
Admission: EM | Admit: 2012-05-21 | Discharge: 2012-05-21 | Disposition: A | Discharge status: Home / Self Care | Payer: 59 | Attending: Emergency Medicine | Admitting: Emergency Medicine

## 2012-05-21 ENCOUNTER — Encounter (HOSPITAL_COMMUNITY): Payer: Self-pay | Admitting: Emergency Medicine

## 2012-05-21 DIAGNOSIS — W19XXXA Unspecified fall, initial encounter: Secondary | ICD-10-CM

## 2012-05-21 DIAGNOSIS — M810 Age-related osteoporosis without current pathological fracture: Secondary | ICD-10-CM | POA: Insufficient documentation

## 2012-05-21 DIAGNOSIS — Z8739 Personal history of other diseases of the musculoskeletal system and connective tissue: Secondary | ICD-10-CM | POA: Insufficient documentation

## 2012-05-21 DIAGNOSIS — Y92009 Unspecified place in unspecified non-institutional (private) residence as the place of occurrence of the external cause: Secondary | ICD-10-CM | POA: Insufficient documentation

## 2012-05-21 DIAGNOSIS — S0181XA Laceration without foreign body of other part of head, initial encounter: Secondary | ICD-10-CM

## 2012-05-21 DIAGNOSIS — R51 Headache: Secondary | ICD-10-CM | POA: Insufficient documentation

## 2012-05-21 DIAGNOSIS — S0180XA Unspecified open wound of other part of head, initial encounter: Secondary | ICD-10-CM | POA: Insufficient documentation

## 2012-05-21 DIAGNOSIS — W010XXA Fall on same level from slipping, tripping and stumbling without subsequent striking against object, initial encounter: Secondary | ICD-10-CM | POA: Insufficient documentation

## 2012-05-21 MED ORDER — ONDANSETRON 4 MG PO TBDP
4.0000 mg | ORAL_TABLET | Freq: Once | ORAL | Status: AC
  Administered 2012-05-21: 4 mg via ORAL
  Filled 2012-05-21: qty 1

## 2012-05-21 MED ORDER — BACITRACIN ZINC 500 UNIT/GM EX OINT
TOPICAL_OINTMENT | CUTANEOUS | Status: AC
  Filled 2012-05-21: qty 0.9

## 2012-05-21 NOTE — ED Provider Notes (Signed)
History     CSN: [id number]  Arrival date & time 05/21/12  1653   First MD Initiated Contact with Patient 05/21/12 1656      Chief Complaint  Patient presents with  . Fall    (Consider location/radiation/quality/duration/timing/severity/associated sxs/prior treatment) Patient is a 56 y.o. female presenting with fall. The history is provided by the patient.  Fall Associated symptoms include headaches. Pertinent negatives include no numbness and no abdominal pain.   patient has multiple system atrophy, which is movement disorder. Because of this she had a fall. She is in the bathroom and hit her left forehead. No loss of consciousness. No dizziness. No other injury or pain. Chest pain. No bowel pain. No neck pain.  Past Medical History  Diagnosis Date  . Multiple system atrophy     Followed  [facility] in movement disorder clinic-Dr Rubin Payor  . Osteoporosis   . Arthritis     Knees affected    Past Surgical History  Procedure Date  . 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 Tonsillectomy   . Refractive surgery   . Neuroendoscopic placement / replacement ventricular catheter w/ attachment shunt / external drain     May 2012    Family History  Problem Relation Age of Onset  . Heart disease Mother   . Heart disease Father   . HIV Father   . Hyperlipidemia Brother     History  Substance Use Topics  . Smoking status: Never Smoker   . Smokeless tobacco: Never Used  . Alcohol Use: Yes    OB History    Grav Para Term Preterm Abortions TAB SAB Ect Mult Living                  Review of Systems  Constitutional: Negative for fatigue.  Respiratory: Negative for chest tightness.   Cardiovascular: Negative for chest pain.  Gastrointestinal: Negative for abdominal pain.  Musculoskeletal: Negative for myalgias.  Skin: Negative for rash.  Neurological: Positive for headaches. Negative for weakness and numbness.    Allergies  Ibuprofen and Sulfa antibiotics  Home  Medications   Current Outpatient Rx  Name Route Sig Dispense Refill  . ALENDRONATE SODIUM 70 MG PO TABS Oral Take 1 tablet (70 mg total) by mouth every 7 (seven) days. Take with a full glass of water on an empty stomach. 12 tablet 6  . ASPIRIN 81 MG PO CHEW Oral Chew 81 mg by mouth daily.    Marland Kitchen CARBIDOPA-LEVODOPA ER 50-200 MG PO TBCR  1 tab in AM , 1 tab in the afternoon , and 1/2 tablet at bedtime     . FLUDROCORTISONE ACETATE 0.1 MG PO TABS  Alternate 1/2 tab with 1 tab daily    . MIDODRINE HCL 5 MG PO TABS Oral Take 5 mg by mouth 2 (two) times daily with a meal.      . ADULT MULTIVITAMIN W/MINERALS CH Oral Take 1 tablet by mouth daily.      BP 134/92  Pulse 80  Temp(Src) 97.2 F (36.2 C) (Oral)  Resp 16  SpO2 100%  Physical Exam  Constitutional: She is oriented to person, place, and time. She appears well-developed.  HENT:       Approximately 2.5 cm laceration with central flap. Dislocated of her left eye over the orbital ridge laterally. Mild ecchymosis to left eye. Extraocular movements intact. Minimal tenderness to left cheekbone.  Neck: Normal range of motion.  Cardiovascular: Normal rate.   Pulmonary/Chest:  Effort normal.  Abdominal: Soft.  Musculoskeletal: Normal range of motion.  Neurological: She is alert and oriented to person, place, and time. No cranial nerve deficit.  Skin: Skin is warm.    ED Course  Procedures (including critical care time)  Labs Reviewed - No data to display Ct Head Wo Contrast  05/21/2012  *RADIOLOGY REPORT*  Clinical Data: Fall, pain.  Headache.  CT HEAD WITHOUT CONTRAST  Technique:  Contiguous axial images were obtained from the base of the skull through the vertex without contrast.  Comparison: MRI head 10/19/2011  Findings: There is no evidence for acute infarction, intracranial hemorrhage, mass lesion, hydrocephalus, or extra-axial fluid.  The patient is status post a suboccipital craniectomy for treatment of some type of posterior fossa  cyst which appears similar to prior MR.  Moderate fourth ventricular enlargement is stable.  No obstructive hydrocephalus.  No skull fracture.  Moderate left frontal supraorbital scalp hematoma.  No sinus opacity.  Mastoids negative.  IMPRESSION: Left frontal scalp hematoma.  No intracranial hemorrhage or skull fracture.  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  Original Report Authenticated By: Elsie Stain, M.D.     1. Fall   2. Facial laceration    LACERATION REPAIR Performed by: Billee Cashing. Authorized by: Billee Cashing Consent: Verbal consent obtained. Risks and benefits: risks, benefits and alternatives were discussed Consent given by: patient Patient identity confirmed: provided demographic data Prepped and Draped in normal sterile fashion Wound explored Complex facial laceration Laceration Location: right forehead  Laceration Length: 2.5cm  No Foreign Bodies seen or palpated  Anesthesia: local infiltration  Local anesthetic: lidocaine 2% with epinephrine  Anesthetic total: 7 ml  Irrigation method: syringe Amount of cleaning: standard  Skin closure: 5-0 nylon for skin, 5-0 vicryl rapid in subq  Number of sutures: 4 deep, 10 skin  Technique: simple interupted.  Patient tolerance: Patient tolerated the procedure well with no immediate complications.   MDM  Facial laceration after mechanical fall. Repaired negative head CT.       Juliet Rude. Rubin Payor, MD 05/21/12 2053

## 2012-05-21 NOTE — ED Notes (Signed)
[id number]<AV> Expected date:05/21/12<BR> Expected time: 4:44 PM<BR> Means of arrival:Ambulance<BR> Comments:<BR> Fall-LSB

## 2012-05-21 NOTE — ED Notes (Signed)
Pt. Was using the toilet when she lost her balance and fell forward hitting her left forehead.  No loss of conciousness.  Pt. Has one inch laceration to left forehead.  Pt. Denies dizziness.

## 2012-05-21 NOTE — Discharge Instructions (Signed)
Facial Laceration  A facial laceration is a cut on the face. Lacerations usually heal quickly, but they need special care to reduce scarring. It will take 1 to 2 years for the scar to lose its redness and to heal completely.  TREATMENT   Some facial lacerations may not require closure. Some lacerations may not be able to be closed due to an increased risk of infection. It is important to see your caregiver as soon as possible after an injury to minimize the risk of infection and to maximize the opportunity for successful closure.  If closure is appropriate, pain medicines may be given, if needed. The wound will be cleaned to help prevent infection. Your caregiver will use stitches (sutures), staples, wound glue (adhesive), or skin adhesive strips to repair the laceration. These tools bring the skin edges together to allow for faster healing and a better cosmetic outcome. However, all wounds will heal with a scar.   Once the wound has healed, scarring can be minimized by covering the wound with sunscreen during the day for 1 full year. Use a sunscreen with an SPF of at least 30. Sunscreen helps to reduce the pigment that will form in the scar. When applying sunscreen to a completely healed wound, massage the scar for a few minutes to help reduce the appearance of the scar. Use circular motions with your fingertips, on and around the scar. Do not massage a healing wound.  HOME CARE INSTRUCTIONS  For sutures:   Keep the wound clean and dry.   If you were given a bandage (dressing), you should change it at least once a day. Also change the dressing if it becomes wet or dirty, or as directed by your caregiver.   Wash the wound with soap and water 2 times a day. Rinse the wound off with water to remove all soap. Pat the wound dry with a clean towel.   After cleaning, apply a thin layer of the antibiotic ointment recommended by your caregiver. This will help prevent infection and keep the dressing from sticking.   You  may shower as usual after the first 24 hours. Do not soak the wound in water until the sutures are removed.   Only take over-the-counter or prescription medicines for pain, discomfort, or fever as directed by your caregiver.   Get your sutures removed as directed by your caregiver. With facial lacerations, sutures should usually be taken out after 4 to 5 days to avoid stitch marks.   Wait a few days after your sutures are removed before applying makeup.  For skin adhesive strips:   Keep the wound clean and dry.   Do not get the skin adhesive strips wet. You may bathe carefully, using caution to keep the wound dry.   If the wound gets wet, pat it dry with a clean towel.   Skin adhesive strips will fall off on their own. You may trim the strips as the wound heals. Do not remove skin adhesive strips that are still stuck to the wound. They will fall off in time.  For wound adhesive:   You may briefly wet your wound in the shower or bath. Do not soak or scrub the wound. Do not swim. Avoid periods of heavy perspiration until the skin adhesive has fallen off on its own. After showering or bathing, gently pat the wound dry with a clean towel.   Do not apply liquid medicine, cream medicine, ointment medicine, or makeup to your wound while the   skin adhesive is in place. This may loosen the film before your wound is healed.   If a dressing is placed over the wound, be careful not to apply tape directly over the skin adhesive. This may cause the adhesive to be pulled off before the wound is healed.   Avoid prolonged exposure to sunlight or tanning lamps while the skin adhesive is in place. Exposure to ultraviolet light in the first year will darken the scar.   The skin adhesive will usually remain in place for 5 to 10 days, then naturally fall off the skin. Do not pick at the adhesive film.  You may need a tetanus shot if:   You cannot remember when you had your last tetanus shot.   You have never had a tetanus  shot.  If you get a tetanus shot, your arm may swell, get red, and feel warm to the touch. This is common and not a problem. If you need a tetanus shot and you choose not to have one, there is a rare chance of getting tetanus. Sickness from tetanus can be serious.  SEEK IMMEDIATE MEDICAL CARE IF:   You develop redness, pain, or swelling around the wound.   There is yellowish-white fluid (pus) coming from the wound.   You develop chills or a fever.  MAKE SURE YOU:   Understand these instructions.   Will watch your condition.   Will get help right away if you are not doing well or get worse.  Document Released: 01/19/2005 Document Revised: 12/01/2011 Document Reviewed: 06/06/2011  [employer] Patient Information 2012 [employer].

## 2012-05-22 ENCOUNTER — Telehealth: Payer: Self-pay | Admitting: Internal Medicine

## 2012-05-22 NOTE — Telephone Encounter (Signed)
Please advise - rov for stitch removal

## 2012-05-22 NOTE — Telephone Encounter (Signed)
Monday should be  [facility] for removal

## 2012-05-22 NOTE — Telephone Encounter (Signed)
Pt went to ER yesterday because she had fallen and hit head. Pt rcvd approx 10 stitches above lft eye brow. Pt had a pretty deep cut. Pt was told to have stitches removed in 5-7 days and has sch an ov with Dr Amador Cunas on Monday. Pt wants to know if Dr Amador Cunas feels that stitches should remain in a little longer?

## 2012-05-23 NOTE — Telephone Encounter (Signed)
Pt has been informed that Monday is ok for stitch removal, as noted.

## 2012-05-28 ENCOUNTER — Ambulatory Visit: Payer: 59 | Admitting: Internal Medicine

## 2012-05-29 ENCOUNTER — Encounter: Payer: Self-pay | Admitting: Internal Medicine

## 2012-05-29 ENCOUNTER — Ambulatory Visit (INDEPENDENT_AMBULATORY_CARE_PROVIDER_SITE_OTHER): Payer: 59 | Admitting: Internal Medicine

## 2012-05-29 VITALS — BP 90/60 | Temp 98.1°F | Wt 99.0 lb

## 2012-05-29 DIAGNOSIS — G238 Other specified degenerative diseases of basal ganglia: Secondary | ICD-10-CM

## 2012-05-29 DIAGNOSIS — M810 Age-related osteoporosis without current pathological fracture: Secondary | ICD-10-CM

## 2012-05-29 NOTE — Patient Instructions (Signed)
Return office visit 3 months 

## 2012-05-29 NOTE — Progress Notes (Signed)
  Subjective:    Patient ID: [name], female    DOB: Aug 05, 1956, 56 y.o.   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  She is also seen today for suture removal. She fell 8 days ago resulting in a laceration above her left eyebrow. She has a history of osteoporosis. Presently she takes Florinef 0.1 mg odd days and one half tablet on even days. She continues to have orthostatic symptoms. Her husband does monitor supine blood pressures that are never elevated.   Review of Systems  HENT: Negative for hearing loss, congestion, sore throat, rhinorrhea, dental problem, sinus pressure and tinnitus.   Eyes: Negative for pain, discharge and visual disturbance.  Respiratory: Negative for cough and shortness of breath.   Cardiovascular: Negative for chest pain, palpitations and leg swelling.  Gastrointestinal: Negative for nausea, vomiting, abdominal pain, diarrhea, constipation, blood in stool and abdominal distention.  Genitourinary: Negative for dysuria, urgency, frequency, hematuria, flank pain, vaginal bleeding, vaginal discharge, difficulty urinating, vaginal pain and pelvic pain.  Musculoskeletal: Positive for gait problem. Negative for joint swelling and arthralgias.  Skin: Negative for rash.  Neurological: Positive for syncope, weakness and light-headedness. Negative for dizziness, speech difficulty, numbness and headaches.  Hematological: Negative for adenopathy.  Psychiatric/Behavioral: Negative for behavioral problems, dysphoric mood and agitation. The patient is not nervous/anxious.        Objective:   Physical Exam  Constitutional: She is oriented to person, place, and time. She appears well-developed and well-nourished.       Blood pressure supine 90/60  HENT:  Head: Normocephalic.  Right Ear: External ear normal.  Left Ear: External ear normal.  Mouth/Throat: Oropharynx is clear and moist.  Eyes: Conjunctivae and EOM are normal. Pupils are  equal, round, and reactive to light.  Neck: Normal range of motion. Neck supple. No thyromegaly present.  Cardiovascular: Normal rate, regular rhythm, normal heart sounds and intact distal pulses.   Pulmonary/Chest: Effort normal and breath sounds normal.  Abdominal: Soft. Bowel sounds are normal. She exhibits no mass. There is no tenderness.  Musculoskeletal: Normal range of motion.  Lymphadenopathy:    She has no cervical adenopathy.  Neurological: She is alert and oriented to person, place, and time.  Skin: Skin is warm and dry. No rash noted.       Multiple sutures were removed from a laceration above the left eyebrow.  Psychiatric: She has a normal mood and affect. Her behavior is normal.          Assessment & Plan:   MSA. We'll increase Florinef to 0.1 mg twice daily. Her husband will monitor supine blood pressures and will call if blood pressures become elevated Suture removal History of osteoporosis

## 2012-06-08 ENCOUNTER — Ambulatory Visit: Payer: 59 | Admitting: Internal Medicine

## 2012-07-02 ENCOUNTER — Telehealth: Payer: Self-pay | Admitting: Internal Medicine

## 2012-07-02 DIAGNOSIS — R32 Unspecified urinary incontinence: Secondary | ICD-10-CM

## 2012-07-02 NOTE — Telephone Encounter (Signed)
Please advise 

## 2012-07-02 NOTE — Telephone Encounter (Signed)
Caller: Randall/Child; Phone Number: [phone]; Message from caller: Brother is calling to update provider on condition and to see if they can get an order to have a urinary catheter placed.  He states that pt is having more issues with incontinence and wetting herself all day and night with her medical condition.  He does not want to have to bring her into office if possible but wants to know if an order can be given to home health for a catheter.  He is hoping this can be done this week because they are going out of town next week.  He did contact her Neurology doctor office already and was not able to get assistance with issue. He states she gets daily health care from [facility] but they have also used [facility] in the past.

## 2012-07-04 ENCOUNTER — Telehealth: Payer: Self-pay

## 2012-07-04 NOTE — Telephone Encounter (Signed)
Brother returning Dr. Vernon Prey call - call back at hm# [phone]

## 2012-07-04 NOTE — Telephone Encounter (Signed)
Please arrange for insertion of Foley catheter with [facility] and for monthly Foley catheter change

## 2012-07-04 NOTE — Telephone Encounter (Signed)
Opened in error

## 2012-07-06 ENCOUNTER — Telehealth: Payer: Self-pay | Admitting: Internal Medicine

## 2012-07-06 NOTE — Telephone Encounter (Signed)
Gave Vo to angie

## 2012-07-06 NOTE — Telephone Encounter (Signed)
[facility] called and said that pt is req to get PT and OT to come and help pt out. Need verbal order pls call.

## 2012-08-28 ENCOUNTER — Other Ambulatory Visit: Payer: Self-pay | Admitting: Internal Medicine

## 2012-09-03 ENCOUNTER — Encounter: Payer: Self-pay | Admitting: Internal Medicine

## 2012-09-03 ENCOUNTER — Ambulatory Visit (INDEPENDENT_AMBULATORY_CARE_PROVIDER_SITE_OTHER): Payer: 59 | Admitting: Internal Medicine

## 2012-09-03 VITALS — BP 120/80 | Wt 100.0 lb

## 2012-09-03 DIAGNOSIS — Z23 Encounter for immunization: Secondary | ICD-10-CM

## 2012-09-03 DIAGNOSIS — G238 Other specified degenerative diseases of basal ganglia: Secondary | ICD-10-CM

## 2012-09-03 NOTE — Patient Instructions (Signed)
Return in 6 months for follow-up  Call or return to clinic prn if these symptoms worsen or fail to improve as anticipated.  

## 2012-09-03 NOTE — Progress Notes (Signed)
Subjective:    Patient ID: [name], female    DOB: [birth year], 56 y.o.   MRN: [id number]  HPI  56 year old patient who is seen today for followup. She has a history of multiple system atrophy and is followed both here locally and at [facility] by neurology. She has significant orthostasis which has been managed well but continues to have significant supine hypertension. Generally doing about the same. She perhaps is having some more swallowing difficulties. This was discussed with  neurology last month. She now has a chronic Foley catheter due to her mobility issues. This has been changed monthly  Past Medical History  Diagnosis Date  . Multiple system atrophy     Followed  [facility] in movement disorder clinic-Dr Rubin Payor  . Osteoporosis   . Arthritis     Knees affected    History   Social History  . Marital Status: Single    Spouse Name: N/A    Number of Children: N/A  . Years of Education: N/A   Occupational History  . Not on file.   Social History Main Topics  . Smoking status: Never Smoker   . Smokeless tobacco: Never Used  . Alcohol Use: Yes  . Drug Use: No  . Sexually Active: Not on file   Other Topics Concern  . 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 Tonsillectomy   . Refractive surgery   . Neuroendoscopic placement / replacement ventricular catheter w/ attachment shunt / external drain     May 2012    Family History  Problem Relation Age of Onset  . Heart disease Mother   . Heart disease Father   . HIV Father   . Hyperlipidemia Brother     Allergies  Allergen Reactions  . Ibuprofen     Facial swelling  . Sulfa Antibiotics     Unknown - happened as a baby    Current Outpatient Prescriptions on File Prior to Visit  Medication Sig Dispense Refill  . aspirin 81 MG chewable tablet  Chew 81 mg by mouth daily.      . Calcium Carbonate-Vitamin D (CALCIUM PLUS VITAMIN D PO) Take by mouth daily.      . carbidopa-levodopa (SINEMET CR) 50-200 MG per tablet 1 tab in AM , 1 tab in the afternoon , and 1 tablet at bedtime      . fludrocortisone (FLORINEF) 0.1 MG tablet TAKE 2 TABLETS BY MOUTH EVERY DAY  60 tablet  1  . midodrine (PROAMATINE) 5 MG tablet Take 5 mg by mouth 2 (two) times daily with a meal.        . Multiple Vitamin (MULITIVITAMIN WITH MINERALS) TABS Take 1 tablet by mouth daily.        BP 120/80  Wt 100 lb (45.36 kg)       Review of Systems  Constitutional: Negative.   HENT: Negative for hearing loss, congestion, sore throat, rhinorrhea, dental problem, sinus pressure and tinnitus.   Eyes: Negative for pain, discharge and visual disturbance.  Respiratory: Negative for cough and shortness of breath.   Cardiovascular: Negative for chest pain, palpitations and leg swelling.  Gastrointestinal: Negative for nausea, vomiting, abdominal pain, diarrhea, constipation, blood in stool and abdominal distention.  Genitourinary: Negative for dysuria, urgency, frequency, hematuria, flank pain, vaginal bleeding, vaginal discharge,  difficulty urinating, vaginal pain and pelvic pain.  Musculoskeletal: Positive for gait problem. Negative for joint swelling and arthralgias.  Skin: Negative for rash.  Neurological: Negative for dizziness, syncope, speech difficulty, weakness, numbness and headaches.  Hematological: Negative for adenopathy.  Psychiatric/Behavioral: Negative for behavioral problems, dysphoric mood and agitation. The patient is not nervous/anxious.        Objective:   Physical Exam  Constitutional: She is oriented to person, place, and time. She appears well-developed and well-nourished. No distress.       Blood pressure sitting 140/80  HENT:  Head: Normocephalic.  Right Ear: External ear normal.  Left Ear: External ear normal.  Mouth/Throat: Oropharynx is  clear and moist.  Eyes: Conjunctivae and EOM are normal. Pupils are equal, round, and reactive to light.  Neck: Normal range of motion. Neck supple. No thyromegaly present.  Cardiovascular: Normal rate, regular rhythm, normal heart sounds and intact distal pulses.   Pulmonary/Chest: Effort normal and breath sounds normal.  Abdominal: Soft. Bowel sounds are normal. She exhibits no mass. There is no tenderness.  Musculoskeletal: Normal range of motion. She exhibits edema.       Trace edema  Lymphadenopathy:    She has no cervical adenopathy.  Neurological: She is alert and oriented to person, place, and time.  Skin: Skin is warm and dry. No rash noted.  Psychiatric: She has a normal mood and affect. Her behavior is normal.          Assessment & Plan:   MSA Orthostatic hypotension  No change in therapy Flu vaccine administered Recheck 6 months

## 2012-09-14 IMAGING — CT CT HEAD W/O CM
2 series · 15 of 30 positions shown, 17 images · non-contrast
Comparison: MRI head 10/19/2011

CLINICAL DATA: Fall, pain.  Headache.

CT HEAD WITHOUT CONTRAST
TECHNIQUE: Contiguous axial images were obtained from the base of
the skull through the vertex without contrast.

[Series 2: head w/o · axial · non-contrast · 0.43mm/px · z∈[+1546,+1650]mm · 7 of 29 slices shown, 9 images]
[im 4/29  brain]
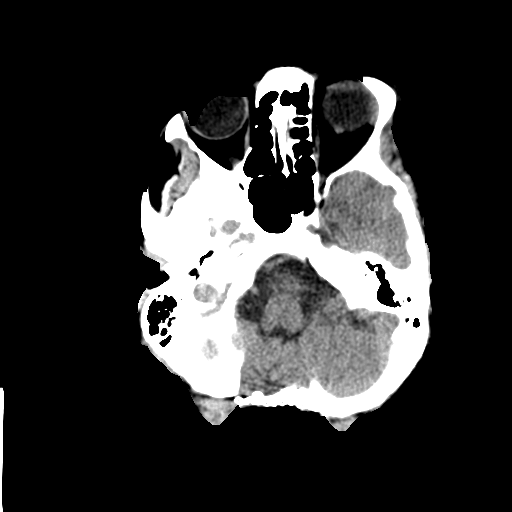
[im 4/29  bone]
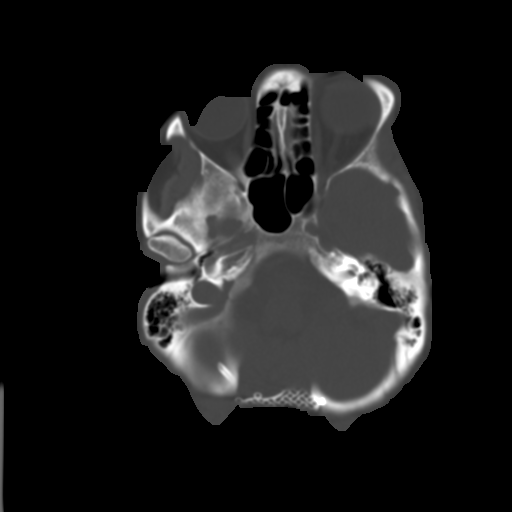
[im 8/29  brain]
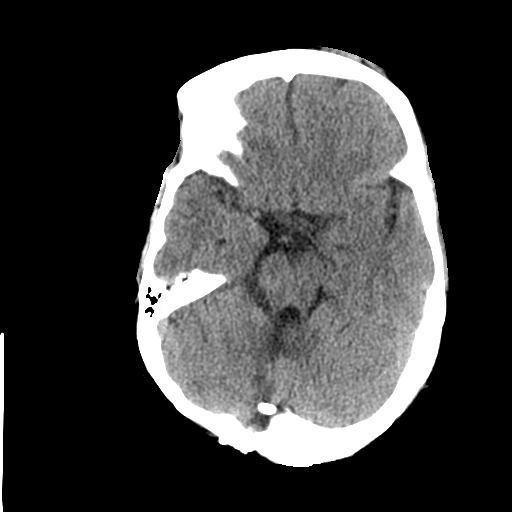
[im 11/29  brain]
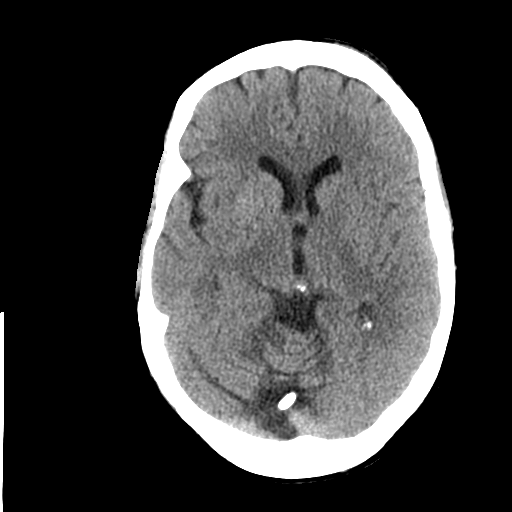
[im 15/29  brain]
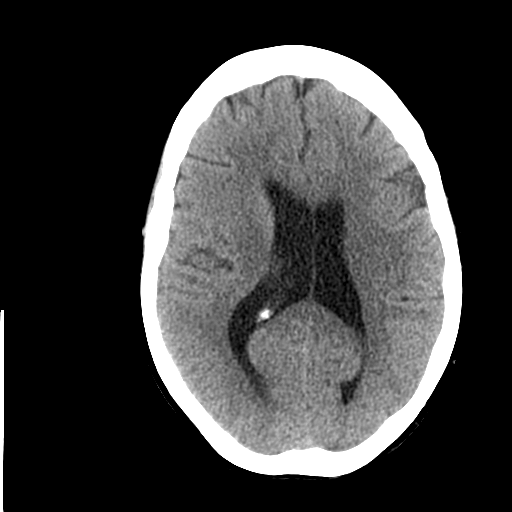
[im 18/29  brain]
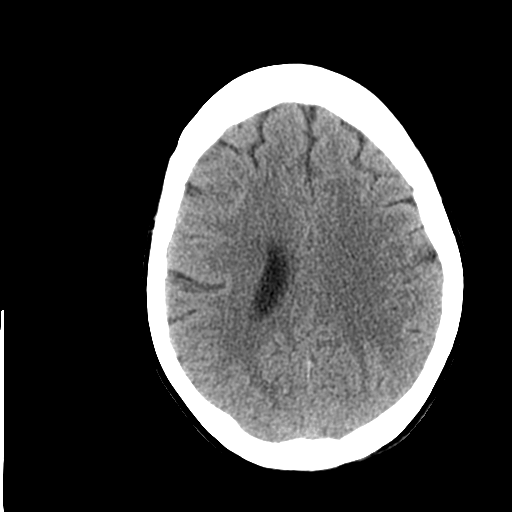
[im 18/29  bone]
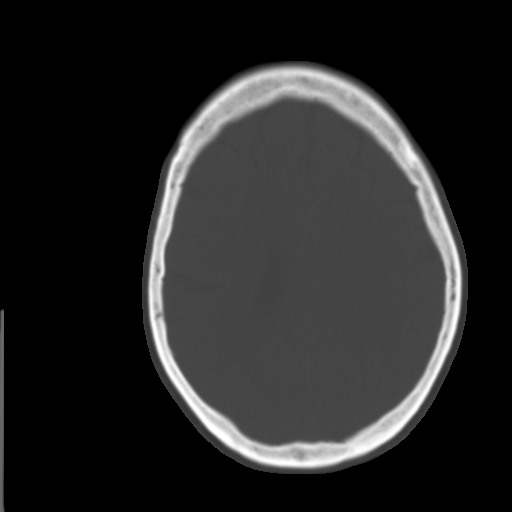
[im 22/29  brain]
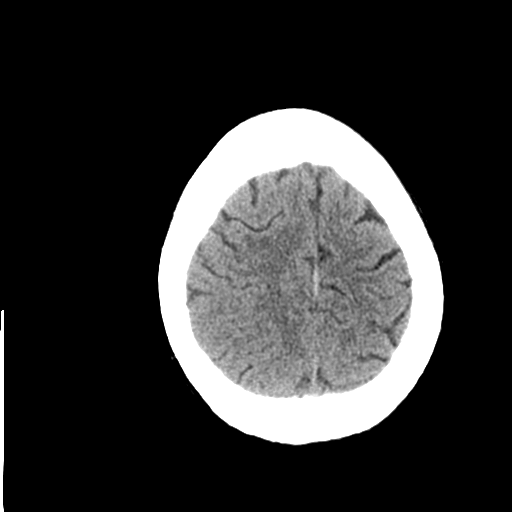
[im 25/29  brain]
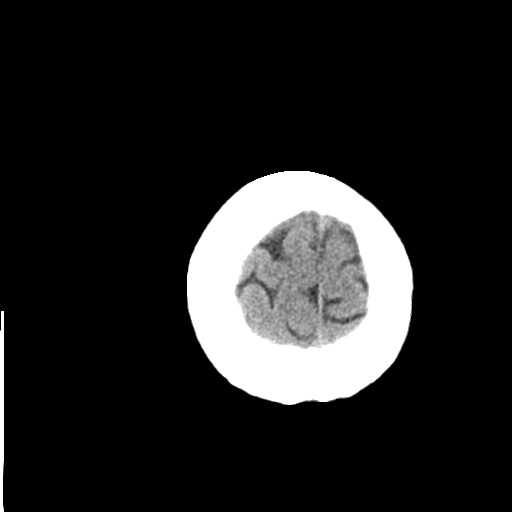

[Series 3: bone windows · axial · 0.43mm/px · z∈[+1544,+1656]mm · 8 of 71 slices shown]
[im 8/71  bone]
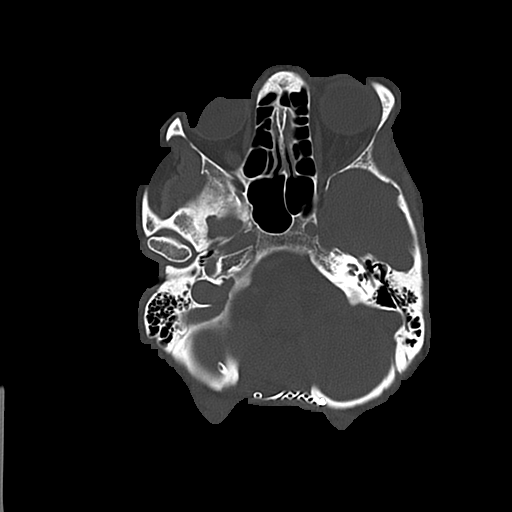
[im 15/71  bone]
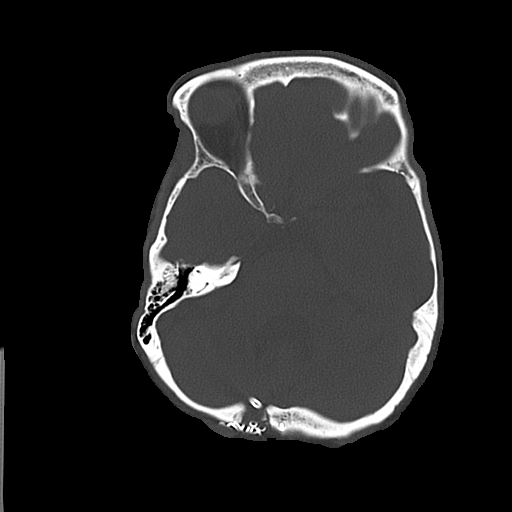
[im 22/71  bone]
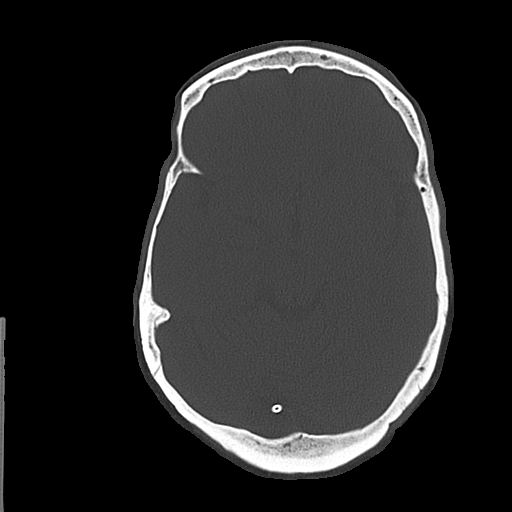
[im 32/71  bone]
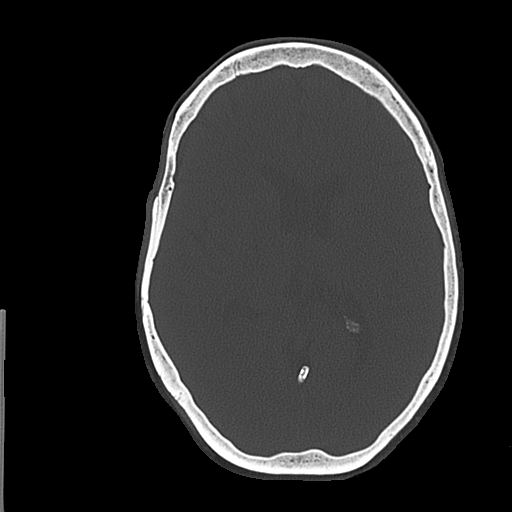
[im 39/71  bone]
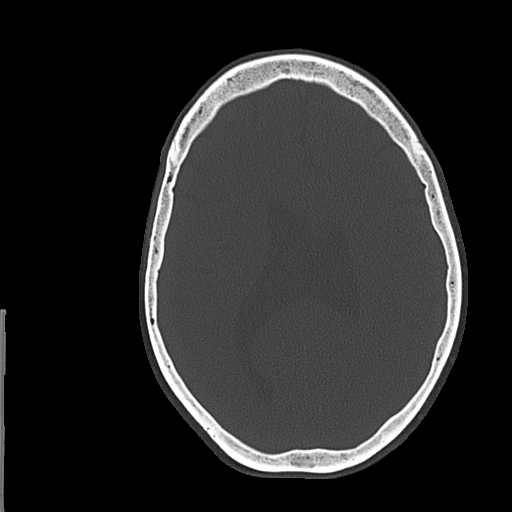
[im 50/71  bone]
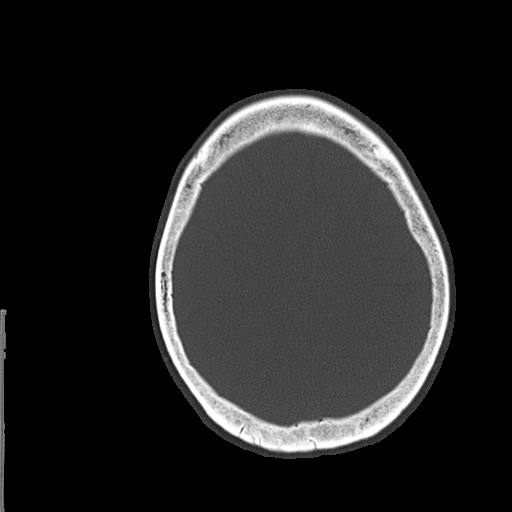
[im 57/71  bone]
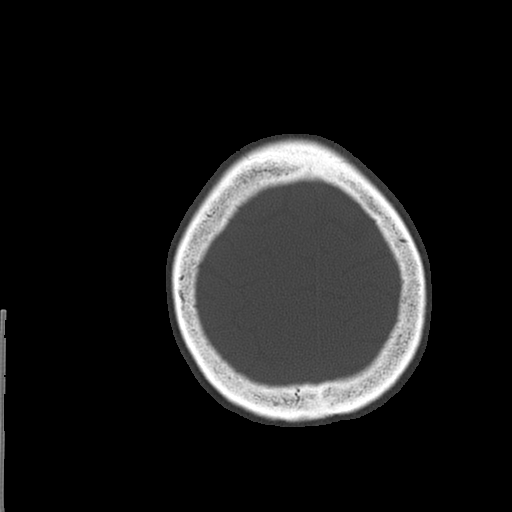
[im 64/71  bone]
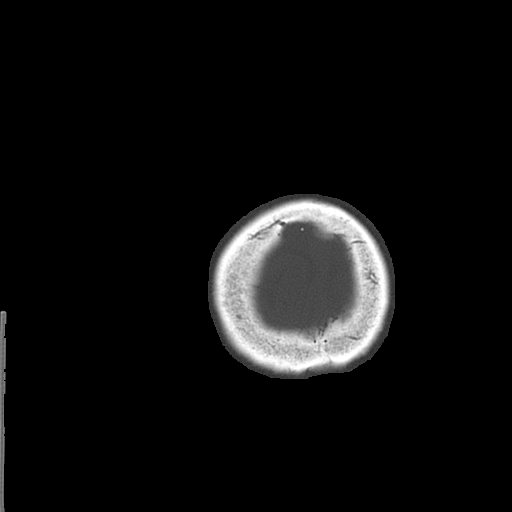

[15 of 30 positions shown; findings below may reference images not displayed]

FINDINGS: There is no evidence for acute infarction, intracranial
hemorrhage, mass lesion, hydrocephalus, or extra-axial fluid.  The
patient is status post a suboccipital craniectomy for treatment of
some type of posterior fossa cyst which appears similar to prior
MR.  Moderate fourth ventricular enlargement is stable.  No
obstructive hydrocephalus.  No skull fracture.  Moderate left
frontal supraorbital scalp hematoma.  No sinus opacity.  Mastoids
negative.
IMPRESSION: Left frontal scalp hematoma.  No intracranial hemorrhage or skull
fracture.  Chronic changes posterior fossa status post some type of
superior vermian cyst lesion shunting procedure.

## 2012-09-20 DIAGNOSIS — M810 Age-related osteoporosis without current pathological fracture: Secondary | ICD-10-CM

## 2012-09-20 DIAGNOSIS — I951 Orthostatic hypotension: Secondary | ICD-10-CM

## 2012-09-20 DIAGNOSIS — R32 Unspecified urinary incontinence: Secondary | ICD-10-CM

## 2012-09-20 DIAGNOSIS — Z466 Encounter for fitting and adjustment of urinary device: Secondary | ICD-10-CM

## 2012-09-20 DIAGNOSIS — G238 Other specified degenerative diseases of basal ganglia: Secondary | ICD-10-CM

## 2012-10-02 ENCOUNTER — Telehealth: Payer: Self-pay | Admitting: Internal Medicine

## 2012-10-02 MED ORDER — CIPROFLOXACIN HCL 500 MG PO TABS: 500.0000 mg | ORAL_TABLET | Freq: Two times a day (BID) | ORAL | Status: DC

## 2012-10-02 NOTE — Telephone Encounter (Signed)
Generic Cipro 500 #14 one twice a day  

## 2012-10-02 NOTE — Telephone Encounter (Signed)
rx done

## 2012-10-02 NOTE — Telephone Encounter (Signed)
Caller: Charletta Cousin; Patient Name: [name]; PCP: Eleonore Chiquito [facility]); [facility] Phone Number: [phone] Caregiver calling because antibiotic has not been sent to the pharmacy. Per EPIC Cipro was sent in today. I called [facility] [address] and per pharmacist they have received prescription.

## 2012-10-02 NOTE — Telephone Encounter (Signed)
Pt is running a fever of 100 to 100.4. Pt said that Dr Amador Cunas told pt that he would call in an abx if pt needed one. Pls call in to [facility] on [address].

## 2012-10-02 NOTE — Telephone Encounter (Signed)
Please advise 

## 2012-10-09 ENCOUNTER — Telehealth: Payer: Self-pay | Admitting: Internal Medicine

## 2012-10-09 NOTE — Telephone Encounter (Signed)
Spoke with tp- inforemd of dr. Vernon Prey instructions - home health care nurse will be in the home tomorrow - will speak with her at that time

## 2012-10-09 NOTE — Telephone Encounter (Signed)
If patient develops fever, perform a urine culture and sensitivity;  change Foley catheter if this has not been done within the last 30 days

## 2012-10-09 NOTE — Telephone Encounter (Signed)
Please advise 

## 2012-10-09 NOTE — Telephone Encounter (Signed)
Caller: Jackie/ [facility] Patient Name: [name]; PCP: Eleonore Chiquito [facility]); [facility] Phone Number: [phone]. Call regarding Hematuria with foley catheter.  Onset 10/09/12.  Pt recently treated for UTI/Fever and finished her anbiotic10/14/13. Temp 98.9 Tympanic @ 1327 10/09/12.  Spoke to [place] her caregiver and she described  the urine as cranberry colored. Pt denies pain. [facility] is not able see pt today.   Urgent symptoms rule out per Bloody Urine protocol due tp 'Any temperature elevation '.  Call Provider Immediately.  No appts available with Dr. Kirtland Bouchard.   PLEASE FOLLOW UP WITH PT OR CAREGIVER [name] IN REGARD TO APPT FOR BLOODY URINE.  Thank you.

## 2012-10-10 ENCOUNTER — Telehealth: Payer: Self-pay | Admitting: Internal Medicine

## 2012-10-10 NOTE — Telephone Encounter (Signed)
[facility] called. At pts home this morning. Pt had Hematuria yesterday, but it is clear now. Nurse is going to change Foley cath and take a urine specimen. Pts vitals are ok. Besides usual cramping, pt doesn't have any pain and has finished course of abx yesterday.

## 2012-10-10 NOTE — Telephone Encounter (Signed)
Noted. kik 

## 2012-10-18 ENCOUNTER — Telehealth: Payer: Self-pay | Admitting: Internal Medicine

## 2012-10-18 NOTE — Telephone Encounter (Signed)
It is the re-certification period for patient.  [name] needs a verbal order to continue to change foley and/or prn.  Please call [name] @ [phone] for this verbal order. Thank you!

## 2012-10-18 NOTE — Telephone Encounter (Signed)
Order given

## 2012-10-22 ENCOUNTER — Encounter: Payer: Self-pay | Admitting: Internal Medicine

## 2012-10-30 ENCOUNTER — Telehealth: Payer: Self-pay | Admitting: Internal Medicine

## 2012-10-30 ENCOUNTER — Encounter: Payer: Self-pay | Admitting: Internal Medicine

## 2012-10-30 NOTE — Telephone Encounter (Signed)
Forward to inform 

## 2012-10-30 NOTE — Telephone Encounter (Signed)
Included member and group # on letter and faxed as directed

## 2012-10-30 NOTE — Telephone Encounter (Signed)
Pt called and said that catheter is excluded from pts [facility] insurance. Need to get an appeal from Dr Amador Cunas, in order for catheter to be approved. Pls fax the appeal letter to [facility] attn: Appeals Dept. The fax # [phone] re: Eloisa [place], Mem ID # [id number].

## 2012-10-30 NOTE — Telephone Encounter (Signed)
[name]  group [phone]

## 2012-10-30 NOTE — Telephone Encounter (Signed)
done

## 2012-10-30 NOTE — Telephone Encounter (Signed)
[facility] called. Pt has blood in catheter and foley. [facility] is going to visit pts home today. Will change Foley, if needed.

## 2012-11-08 ENCOUNTER — Telehealth: Payer: Self-pay | Admitting: Internal Medicine

## 2012-11-08 NOTE — Telephone Encounter (Signed)
Pt caregiver will check with facility to obtain [facility] form

## 2012-11-08 NOTE — Telephone Encounter (Signed)
Please advise 

## 2012-11-08 NOTE — Telephone Encounter (Signed)
Laurita Peron is pt's caregiver. It is at the point they need to get pt into skilled nursing. [name] states he needs a SL2 and script in order to do this. His number is [phone]. Pls advise.

## 2012-11-08 NOTE — Telephone Encounter (Signed)
Will be glad to fill out FL2 form;  do we have this here?. This is normally provided by the facility

## 2012-11-16 ENCOUNTER — Other Ambulatory Visit: Payer: Self-pay | Admitting: Internal Medicine

## 2012-11-23 ENCOUNTER — Telehealth: Payer: Self-pay | Admitting: Internal Medicine

## 2012-11-23 NOTE — Telephone Encounter (Signed)
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 Wound has been cleaned and treatment with barrier cream. Pt may need a zinc powder. [facility] on [address].

## 2012-11-26 NOTE — Telephone Encounter (Signed)
Please call in Zinc powder  for patient

## 2012-11-26 NOTE — Telephone Encounter (Signed)
Ok to call in

## 2012-11-26 NOTE — Telephone Encounter (Signed)
Has Zn powder been called in??

## 2012-11-27 NOTE — Telephone Encounter (Signed)
Left message on voicemail home and brother's number.

## 2012-11-27 NOTE — Telephone Encounter (Signed)
Please call the contact person with nursing services to find exactly what they are requesting and directions for use

## 2012-11-29 NOTE — Telephone Encounter (Signed)
Called [facility] and spoke to [place], pt's care giver and asked her what kind of powder she is needing for pt. Annice Pih stated she was not  aware of pressure ulcer someone else covered for her last visit, she is seeing pt tomorrow and will let me know if she needs anything ordered.

## 2012-11-30 MED ORDER — DUODERM CGF DRESSING EX MISC: 1.0000 | CUTANEOUS | Status: DC | PRN

## 2012-11-30 MED ORDER — ZINC OXIDE 12 % EX CREA: 0.5000 g | TOPICAL_CREAM | Freq: Two times a day (BID) | CUTANEOUS | Status: DC

## 2012-11-30 NOTE — Telephone Encounter (Signed)
Returning call to inform Lupita Leash of pt's needs after home visit today  1. Pt needs zinc oxide cream and duoderm as patient had +2 pressure.    2. Foley keeps getting clogged up, has a lot of sediment.  Nurse would like to do half water and half vinegar flushes as needed to avoid clogging.  Please return call.

## 2012-11-30 NOTE — Telephone Encounter (Signed)
Zinc oxide and duoderm sent to pharmacy.  Please advise about foley if they should be replaced or flushed with vinegar and water?

## 2012-11-30 NOTE — Addendum Note (Signed)
Addended by: Kern Reap B on: 11/30/2012 04:47 PM   Modules accepted: Orders

## 2012-12-03 NOTE — Telephone Encounter (Signed)
Spoke with caregiver Annice Pih and she is aware that medications were sent to pharmacy and okay to flush foley.

## 2012-12-17 ENCOUNTER — Other Ambulatory Visit: Payer: Self-pay | Admitting: Internal Medicine

## 2012-12-27 ENCOUNTER — Telehealth: Payer: Self-pay | Admitting: Internal Medicine

## 2012-12-27 NOTE — Telephone Encounter (Signed)
[facility] called stating that patient has ?UTi and they need an order to collect urine and it is also time to recert for the next 60 day period. Please assist.

## 2012-12-27 NOTE — Telephone Encounter (Signed)
Ok to recert;  No need for urine culture unless fever or acute illness; change foley monthly

## 2012-12-27 NOTE — Telephone Encounter (Signed)
Called [name] at [facility] told her Ok to recert; No need for urine culture unless fever or acute illness; change foley monthly per Dr. Amador Cunas. [name]  verbalized understanding, stated pt had low grade fever on Tues but none yesterday or today, urine in foley cloudy with sediment. Paulo Fruit to monitor symptoms if pt developes fever call for order for urine culture. Annice Pih verbalized understanding.

## 2013-01-01 NOTE — Telephone Encounter (Signed)
Urine C&S Start Cipro 500 #14 one twice a day

## 2013-01-01 NOTE — Telephone Encounter (Signed)
[facility] did do the Urine Culture. Pt still feeling bad. Nedra Hai is following up for results of that. Hoping that script was ready to be picked up. She feels pt is in need of meds, low grade fever, and sediment Excessive amount in foley). Nedra Hai will be there until 3:30pm.

## 2013-01-01 NOTE — Telephone Encounter (Signed)
Called and spoke to [place] caregiver for pt, Asked her if she has results of urine culture. Nedra Hai said she does not know. Frederich Balding I talked to [place] on 1/2 and did not give order for urine culture , told her to call back if pt worsens. Nedra Hai said  c/o low grade fever off and on. Foley cloudy with sediment and foul smelling wants something for UTI. Frederich Balding I will get back to her. [name] verbalized understanding.

## 2013-01-02 MED ORDER — CIPROFLOXACIN HCL 500 MG PO TABS: 500.0000 mg | ORAL_TABLET | Freq: Two times a day (BID) | ORAL | Status: DC

## 2013-01-02 NOTE — Telephone Encounter (Signed)
Called [name] and told her [name] from [facility] will be there tomorrow morning to obtain urine culture and then can start antibiotic Cipro 500 mg one twice a day  x 7 days, sent to pharmacy. [name] verbalized understanding. Rx sent to pharmacy.

## 2013-01-02 NOTE — Telephone Encounter (Signed)
Cheri Guppy at [facility] told her Dr. Amador Cunas is ordering a Urine C&S due to caregiver Nedra Hai called yesterday still c/o fever off and on and foley urine cloudy with sediment and foul smelling. [name] verbalized understanding and stated she can do urine culture tomorrow morning. Told her okay I will let Nedra Hai know she will be there tomorrow for culture and can pick up antibiotic afterwards and start. [name] verbalized understanding.

## 2013-01-10 DIAGNOSIS — G238 Other specified degenerative diseases of basal ganglia: Secondary | ICD-10-CM

## 2013-01-10 DIAGNOSIS — R32 Unspecified urinary incontinence: Secondary | ICD-10-CM

## 2013-01-10 DIAGNOSIS — I951 Orthostatic hypotension: Secondary | ICD-10-CM

## 2013-01-10 DIAGNOSIS — M810 Age-related osteoporosis without current pathological fracture: Secondary | ICD-10-CM

## 2013-01-16 ENCOUNTER — Telehealth: Payer: Self-pay | Admitting: Internal Medicine

## 2013-01-16 ENCOUNTER — Other Ambulatory Visit: Payer: Self-pay | Admitting: Internal Medicine

## 2013-01-16 MED ORDER — FLUDROCORTISONE ACETATE 0.1 MG PO TABS: ORAL_TABLET | ORAL | Status: DC

## 2013-01-16 NOTE — Telephone Encounter (Signed)
Patient's brother called stating that his sister's fludrocortisone 01mg  1po bid was changed from 1 per day and was never updated and need a new rx sent to [facility] [address]. Please assist.

## 2013-01-16 NOTE — Telephone Encounter (Signed)
Spoke to pt's brother told him will send new Rx to pharmacy, was sent wrong. Rx sent.

## 2013-02-04 ENCOUNTER — Telehealth: Payer: Self-pay | Admitting: Internal Medicine

## 2013-02-04 NOTE — Telephone Encounter (Signed)
Pt has continuous issues w. Foley clogging, leaking, infection and has to be changed often.  Annice Pih w/ [facility] is looking for answers/advice and any help with this you can give. Pt has bed sore that opens and closes often. Is closed at the moment. Pls advise. Last night caregiver said urine very cloudy, this am more clear.

## 2013-02-06 NOTE — Telephone Encounter (Signed)
Annice Pih from [facility] would like a callback today

## 2013-02-21 ENCOUNTER — Telehealth: Payer: Self-pay | Admitting: Internal Medicine

## 2013-02-21 ENCOUNTER — Encounter (HOSPITAL_COMMUNITY): Payer: Self-pay | Admitting: *Deleted

## 2013-02-21 ENCOUNTER — Emergency Department (HOSPITAL_COMMUNITY): Payer: 59

## 2013-02-21 ENCOUNTER — Inpatient Hospital Stay (HOSPITAL_COMMUNITY)
Admission: EM | Admit: 2013-02-21 | Discharge: 2013-02-25 | DRG: 690 | Disposition: A | Discharge status: Skilled Nursing Facility | Payer: 59 | Attending: Internal Medicine | Admitting: Internal Medicine

## 2013-02-21 DIAGNOSIS — N319 Neuromuscular dysfunction of bladder, unspecified: Secondary | ICD-10-CM | POA: Diagnosis present

## 2013-02-21 DIAGNOSIS — Z681 Body mass index (BMI) 19 or less, adult: Secondary | ICD-10-CM

## 2013-02-21 DIAGNOSIS — Z66 Do not resuscitate: Secondary | ICD-10-CM | POA: Diagnosis present

## 2013-02-21 DIAGNOSIS — L89159 Pressure ulcer of sacral region, unspecified stage: Secondary | ICD-10-CM | POA: Diagnosis present

## 2013-02-21 DIAGNOSIS — L89109 Pressure ulcer of unspecified part of back, unspecified stage: Secondary | ICD-10-CM | POA: Diagnosis present

## 2013-02-21 DIAGNOSIS — R030 Elevated blood-pressure reading, without diagnosis of hypertension: Secondary | ICD-10-CM | POA: Diagnosis present

## 2013-02-21 DIAGNOSIS — Z228 Carrier of other infectious diseases: Secondary | ICD-10-CM

## 2013-02-21 DIAGNOSIS — R531 Weakness: Secondary | ICD-10-CM

## 2013-02-21 DIAGNOSIS — M810 Age-related osteoporosis without current pathological fracture: Secondary | ICD-10-CM | POA: Diagnosis present

## 2013-02-21 DIAGNOSIS — R197 Diarrhea, unspecified: Secondary | ICD-10-CM | POA: Diagnosis present

## 2013-02-21 DIAGNOSIS — K5900 Constipation, unspecified: Secondary | ICD-10-CM | POA: Diagnosis present

## 2013-02-21 DIAGNOSIS — L8992 Pressure ulcer of unspecified site, stage 2: Secondary | ICD-10-CM | POA: Diagnosis present

## 2013-02-21 DIAGNOSIS — N390 Urinary tract infection, site not specified: Secondary | ICD-10-CM | POA: Diagnosis present

## 2013-02-21 DIAGNOSIS — Z7401 Bed confinement status: Secondary | ICD-10-CM

## 2013-02-21 DIAGNOSIS — G238 Other specified degenerative diseases of basal ganglia: Secondary | ICD-10-CM | POA: Diagnosis present

## 2013-02-21 DIAGNOSIS — G909 Disorder of the autonomic nervous system, unspecified: Secondary | ICD-10-CM | POA: Diagnosis present

## 2013-02-21 DIAGNOSIS — E876 Hypokalemia: Secondary | ICD-10-CM

## 2013-02-21 LAB — COMPREHENSIVE METABOLIC PANEL
AST: 8 U/L (ref 0–37)
Albumin: 3.3 g/dL — ABNORMAL LOW (ref 3.5–5.2)
Alkaline Phosphatase: 42 U/L (ref 39–117)
BUN: 12 mg/dL (ref 6–23)
Chloride: 95 mEq/L — ABNORMAL LOW (ref 96–112)
Potassium: <2 mEq/L — CL (ref 3.5–5.1)
Total Bilirubin: 0.7 mg/dL (ref 0.3–1.2)

## 2013-02-21 LAB — CBC WITH DIFFERENTIAL/PLATELET
Eosinophils Relative: 1 % (ref 0–5)
HCT: 37.8 % (ref 36.0–46.0)
Hemoglobin: 12.9 g/dL (ref 12.0–15.0)
Lymphocytes Relative: 24 % (ref 12–46)
Lymphs Abs: 1.5 10*3/uL (ref 0.7–4.0)
MCV: 87.1 fL (ref 78.0–100.0)
Monocytes Absolute: 0.5 10*3/uL (ref 0.1–1.0)
Monocytes Relative: 8 % (ref 3–12)
Platelets: 147 10*3/uL — ABNORMAL LOW (ref 150–400)
RBC: 4.34 MIL/uL (ref 3.87–5.11)
WBC: 6.3 10*3/uL (ref 4.0–10.5)

## 2013-02-21 LAB — URINE MICROSCOPIC-ADD ON

## 2013-02-21 LAB — URINALYSIS, ROUTINE W REFLEX MICROSCOPIC
Glucose, UA: NEGATIVE mg/dL
Ketones, ur: 15 mg/dL — AB
pH: 7.5 (ref 5.0–8.0)

## 2013-02-21 LAB — TROPONIN I: Troponin I: <0.3 ng/mL (ref <0.30)

## 2013-02-21 MED ORDER — HYDROCODONE-ACETAMINOPHEN 5-325 MG PO TABS: 1.0000 | ORAL_TABLET | ORAL | Status: DC | PRN

## 2013-02-21 MED ORDER — POTASSIUM CHLORIDE 10 MEQ/100ML IV SOLN
10.0000 meq | INTRAVENOUS | Status: AC
  Administered 2013-02-21 (×4): 10 meq via INTRAVENOUS
  Filled 2013-02-21 (×5): qty 100

## 2013-02-21 MED ORDER — FLUDROCORTISONE ACETATE 0.1 MG PO TABS
0.1000 mg | ORAL_TABLET | Freq: Two times a day (BID) | ORAL | Status: DC
  Administered 2013-02-21 – 2013-02-22 (×2): 0.1 mg via ORAL
  Filled 2013-02-21 (×3): qty 1

## 2013-02-21 MED ORDER — ZOLPIDEM TARTRATE 5 MG PO TABS
5.0000 mg | ORAL_TABLET | Freq: Every evening | ORAL | Status: DC | PRN
  Filled 2013-02-21: qty 1

## 2013-02-21 MED ORDER — ENOXAPARIN SODIUM 40 MG/0.4ML ~~LOC~~ SOLN
40.0000 mg | SUBCUTANEOUS | Status: DC
  Administered 2013-02-21: 40 mg via SUBCUTANEOUS
  Filled 2013-02-21 (×2): qty 0.4

## 2013-02-21 MED ORDER — ONDANSETRON HCL 4 MG/2ML IJ SOLN: 4.0000 mg | Freq: Four times a day (QID) | INTRAMUSCULAR | Status: DC | PRN

## 2013-02-21 MED ORDER — ASPIRIN 81 MG PO CHEW
81.0000 mg | CHEWABLE_TABLET | Freq: Every day | ORAL | Status: DC
  Administered 2013-02-21 – 2013-02-24 (×4): 81 mg via ORAL
  Filled 2013-02-21 (×5): qty 1

## 2013-02-21 MED ORDER — POTASSIUM CHLORIDE CRYS ER 20 MEQ PO TBCR
40.0000 meq | EXTENDED_RELEASE_TABLET | Freq: Once | ORAL | Status: AC
  Administered 2013-02-21: 40 meq via ORAL
  Filled 2013-02-21: qty 2

## 2013-02-21 MED ORDER — DUODERM CGF DRESSING EX MISC: 1.0000 | CUTANEOUS | Status: DC | PRN

## 2013-02-21 MED ORDER — CARBIDOPA-LEVODOPA ER 50-200 MG PO TBCR
1.0000 | EXTENDED_RELEASE_TABLET | Freq: Three times a day (TID) | ORAL | Status: DC
  Administered 2013-02-21 – 2013-02-25 (×11): 1 via ORAL
  Filled 2013-02-21 (×13): qty 1

## 2013-02-21 MED ORDER — SODIUM CHLORIDE 0.9 % IV SOLN: INTRAVENOUS | Status: DC

## 2013-02-21 MED ORDER — SODIUM CHLORIDE 0.9 % IJ SOLN
3.0000 mL | Freq: Two times a day (BID) | INTRAMUSCULAR | Status: DC
  Administered 2013-02-23 – 2013-02-25 (×4): 3 mL via INTRAVENOUS

## 2013-02-21 MED ORDER — ACETAMINOPHEN 650 MG RE SUPP: 650.0000 mg | Freq: Four times a day (QID) | RECTAL | Status: DC | PRN

## 2013-02-21 MED ORDER — LEVOFLOXACIN IN D5W 250 MG/50ML IV SOLN
250.0000 mg | INTRAVENOUS | Status: DC
  Administered 2013-02-22 (×2): 250 mg via INTRAVENOUS
  Filled 2013-02-21 (×3): qty 50

## 2013-02-21 MED ORDER — SODIUM CHLORIDE 0.45 % IV SOLN
INTRAVENOUS | Status: DC
  Administered 2013-02-21: 23:00:00 via INTRAVENOUS

## 2013-02-21 MED ORDER — ONDANSETRON HCL 4 MG PO TABS: 4.0000 mg | ORAL_TABLET | Freq: Four times a day (QID) | ORAL | Status: DC | PRN

## 2013-02-21 MED ORDER — MIDODRINE HCL 5 MG PO TABS
5.0000 mg | ORAL_TABLET | Freq: Two times a day (BID) | ORAL | Status: DC
  Filled 2013-02-21 (×3): qty 1

## 2013-02-21 MED ORDER — ACETAMINOPHEN 325 MG PO TABS
650.0000 mg | ORAL_TABLET | Freq: Four times a day (QID) | ORAL | Status: DC | PRN
  Administered 2013-02-22 – 2013-02-24 (×3): 650 mg via ORAL
  Filled 2013-02-21 (×3): qty 2

## 2013-02-21 NOTE — ED Provider Notes (Signed)
History     CSN: [id number]  Arrival date & time 02/21/13  1323   First MD Initiated Contact with Patient 02/21/13 1501      Chief Complaint  Patient presents with  . Muscle Pain    cramping    (Consider location/radiation/quality/duration/timing/severity/associated sxs/prior treatment) HPI  Patient her with her brother who is her caregiver. 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 Particia Lather. They report they feel like her medication is not working anymore. They have been noticing this over the last several months but over the last couple weeks she seems to be getting progressively worse. Her brother state she would have a bad day and then have a good day before, but the  last 2 weeks she's just had bad days. She complains of weakness in her muscles and they are aching. He states she's moving a lot less. She's been nonambulatory for at least a year. She states she is able to do her ADLs but less than she has been. She also has had instability of her blood pressure and her brother reports her blood pressure will shoot up to 200 and then dropped down to 60. She states she has dizziness mainly when I stand her up to help her use the bathroom. She also has a mild headache sometimes and indicates her temples. She has mild blurred vision "for a while". She states she has a chest pain in the center of her chest and had it today earlier this afternoon for a few minutes. She states it gets better when they adjust the position of her bed. She also states she's having shortness of breath more often than chest pain  and it is not associated with the chest pain. She denies cough, sore throat, or fever. She's had a Foley catheter for the past 6-8 months. Her brother reports her  urine has been cloudy. She denies nausea or vomiting but does describe diarrhea "for years". She states her muscles are contracting. Brother states sometimes when they stand her up to use the  bathroom she almost passes out.  PCP Dr Amador Cunas Neurologist Dr Particia Lather  Past Medical History  Diagnosis Date  . Multiple system atrophy     Followed  [facility] in movement disorder clinic-Dr Rubin Payor  . Osteoporosis   . Arthritis     Knees affected  . Parkinson disease     Past Surgical History  Procedure Laterality Date  . 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 Tonsillectomy    . Refractive surgery    . Neuroendoscopic placement / replacement ventricular catheter w/ attachment shunt / external drain      May 2012    Family History  Problem Relation Age of Onset  . Heart disease Mother   . Heart disease Father   . HIV Father   . Hyperlipidemia Brother     History  Substance Use Topics  . Smoking status: Never Smoker   . Smokeless tobacco: Never Used  . Alcohol Use: Yes   Lives at home Lives with brother Uses a walker with seat   OB History   Grav Para Term Preterm Abortions TAB SAB Ect Mult Living                  Review of Systems  All other systems reviewed and are negative.    Allergies  Ibuprofen and Sulfa antibiotics  Home Medications   Current Outpatient Rx  Name  Route  Sig  Dispense  Refill  . aspirin 81 MG chewable tablet   Oral   Chew 81 mg by mouth at bedtime.          . carbidopa-levodopa (SINEMET CR) 50-200 MG per tablet   Oral   Take 1 tablet by mouth 3 (three) times daily. 1 tab in AM , 1 tab in the afternoon , and 1 tablet at bedtime         . fludrocortisone (FLORINEF) 0.1 MG tablet   Oral   Take 0.1 mg by mouth 2 (two) times daily.         . midodrine (PROAMATINE) 5 MG tablet   Oral   Take 5 mg by mouth 2 (two) times daily with a meal.           . zinc oxide (BALMEX) 11.3 % CREA cream   Topical   Apply 1 application topically daily. Applied to bedsores.         . Control Gel Formula Dressing (DUODERM CGF DRESSING) MISC   Apply externally   Apply 1 each topically every other day as needed.   20  each   0     BP 125/85  Pulse 66  Temp(Src) 98.3 F (36.8 C) (Oral)  Resp 18  SpO2 98%  Vital signs normal    Physical Exam  Nursing note and vitals reviewed. Constitutional: She is oriented to person, place, and time. She appears well-developed and well-nourished.  Non-toxic appearance. She does not appear ill. No distress.  HENT:  Head: Normocephalic and atraumatic.  Right Ear: External ear normal.  Left Ear: External ear normal.  Nose: Nose normal. No mucosal edema or rhinorrhea.  Mouth/Throat: Oropharynx is clear and moist and mucous membranes are normal. No dental abscesses or edematous.  Eyes: Conjunctivae and EOM are normal. Pupils are equal, round, and reactive to light.  Neck: Normal range of motion and full passive range of motion without pain. Neck supple.  Cardiovascular: Normal rate, regular rhythm and normal heart sounds.  Exam reveals no gallop and no friction rub.   No murmur heard. Pulmonary/Chest: Effort normal and breath sounds normal. No respiratory distress. She has no wheezes. She has no rhonchi. She has no rales. She exhibits no tenderness and no crepitus.  Abdominal: Soft. Normal appearance and bowel sounds are normal. She exhibits no distension. There is no tenderness. There is no rebound and no guarding.  Genitourinary:  Foley has dark yellow/brown urine in bag  Musculoskeletal: Normal range of motion. She exhibits no edema and no tenderness.  Moves all extremities well.   Neurological: She is alert and oriented to person, place, and time. She has normal strength. No cranial nerve deficit.  Diffusely weak, muscles are not tight and do not seem tender  Skin: Skin is warm, dry and intact. No rash noted. No erythema. There is pallor.  Psychiatric: Her speech is normal and behavior is normal. Her mood appears not anxious.  Flat affect    ED Course  Procedures (including critical care time)  Medications  potassium chloride 10 mEq in 100 mL IVPB (10  mEq Intravenous New Bag/Given 02/21/13 1953)  potassium chloride SA (K-DUR,KLOR-CON) CR tablet 40 mEq (40 mEq Oral Given 02/21/13 1835)     Brother wants Dr Particia Lather to see patient in the ED, advised he doesn't come to the ED anymore. Will have the neuro on call evaluate once her labs are back. Brother also states they understand this  is a progressive, terminal illness.   Of note looking at her old charts, they discussed putting her into ALF in November with her PCP Dr Lesia Hausen  17:28 Dr Leroy Kennedy, neurology will consult on patient  Pt started on oral and IV potassium for her very low potassium level  18:40 Dr Sharyon Medicus, admit to tele, team 8  Results for orders placed during the hospital encounter of 02/21/13  CBC WITH DIFFERENTIAL      Result Value Range   WBC 6.3  4.0 - 10.5 K/uL   RBC 4.34  3.87 - 5.11 MIL/uL   Hemoglobin 12.9  12.0 - 15.0 g/dL   HCT 16.1  09.6 - 04.5 %   MCV 87.1  78.0 - 100.0 fL   MCH 29.7  26.0 - 34.0 pg   MCHC 34.1  30.0 - 36.0 g/dL   RDW 40.9  81.1 - 91.4 %   Platelets 147 (*) 150 - 400 K/uL   Neutrophils Relative 66  43 - 77 %   Neutro Abs 4.1  1.7 - 7.7 K/uL   Lymphocytes Relative 24  12 - 46 %   Lymphs Abs 1.5  0.7 - 4.0 K/uL   Monocytes Relative 8  3 - 12 %   Monocytes Absolute 0.5  0.1 - 1.0 K/uL   Eosinophils Relative 1  0 - 5 %   Eosinophils Absolute 0.1  0.0 - 0.7 K/uL   Basophils Relative 0  0 - 1 %   Basophils Absolute 0.0  0.0 - 0.1 K/uL  COMPREHENSIVE METABOLIC PANEL      Result Value Range   Sodium 140  135 - 145 mEq/L   Potassium <2.0 (*) 3.5 - 5.1 mEq/L   Chloride 95 (*) 96 - 112 mEq/L   CO2 37 (*) 19 - 32 mEq/L   Glucose, Bld 94  70 - 99 mg/dL   BUN 12  6 - 23 mg/dL   Creatinine, Ser 7.82 (*) 0.50 - 1.10 mg/dL   Calcium 8.3 (*) 8.4 - 10.5 mg/dL   Total Protein 5.6 (*) 6.0 - 8.3 g/dL   Albumin 3.3 (*) 3.5 - 5.2 g/dL   AST 8  0 - 37 U/L   ALT <5  0 - 35 U/L   Alkaline Phosphatase 42  39 - 117 U/L   Total Bilirubin 0.7  0.3 - 1.2  mg/dL   GFR calc non Af Amer >90  >90 mL/min   GFR calc Af Amer >90  >90 mL/min  TROPONIN I      Result Value Range   Troponin I <0.30  <0.30 ng/mL  CK      Result Value Range   Total CK 33  7 - 177 U/L   Laboratory interpretation all normal except hypokalemia, metabolic alkalosis     Dg Chest Portable 1 View  02/21/2013  *RADIOLOGY REPORT*  Clinical Data: Chest pain.  Shortness of breath.  PORTABLE CHEST - 1 VIEW  Comparison: Chest x-ray 10/19/2011.  Findings: Lung volumes are normal.  The linear opacity in the medial aspect of the left base may represent an area of subsegmental atelectasis and/or scarring.  Pulmonary vasculature is within normal limits.  Heart size appears mildly enlarged, but is likely distorted by patient positioning which also slightly distorts the upper mediastinal contours.  Atherosclerosis in the thoracic aorta.  IMPRESSION: 1.  No radiographic evidence of acute cardiopulmonary disease. 2.  Possible mild cardiomegaly, likely accentuated by poor patient positioning. 3.  Atherosclerosis.   Original  Report Authenticated By: Trudie Reed, M.D.      Date: 02/21/2013  Rate: 66  Rhythm: normal sinus rhythm  QRS Axis: normal  Intervals: normal  ST/T Wave abnormalities: nonspecific ST changes  Conduction Disutrbances:LVH  Narrative Interpretation: baseline wander, artifact  Old EKG Reviewed: none available      1. Weakness   2. Hypokalemia   3. Multiple system atrophy     Plan admission  MDM          Ward Givens, MD 02/21/13 2017

## 2013-02-21 NOTE — H&P (Signed)
[facility]                                                                                    Patient Demographics  [name], is a 57 y.o. female  CSN: [id number]  MRN: [id number]  DOB - 03/01/1956  Admit Date - 02/21/2013  Outpatient Primary MD for the patient is Rogelia Boga, MD   With History of -  Past Medical History  Diagnosis Date  . Multiple system atrophy     Followed  [facility] in movement disorder clinic-Dr Rubin Payor  . Osteoporosis   . Arthritis     Knees affected  . Parkinson disease       Past Surgical History  Procedure Laterality Date  . 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 Tonsillectomy    . Refractive surgery    . Neuroendoscopic placement / replacement ventricular catheter w/ attachment shunt / external drain      May 2012    in for   Chief Complaint  Patient presents with  . Muscle Pain    cramping     HPI  [name]  is a 57 y.o. female, with past medical history significant for multisystem atrophy, followed by neurology, and was taking care of by home health and her own family, presenting today with progressive weakness over the last few days. The patient reports relative diarrhea. She only has a bowel movement every 4 days and her last bowel movement was very soft. No reports of nausea vomiting. No cough or shortness of breath.    Review of Systems    In addition to the HPI above,  Had feverishness at home but no documented fever No Headache, No changes with Vision or hearing, No problems swallowing food or Liquids, No Chest pain, Cough or Shortness of Breath, No Abdominal pain, No Nausea or Vommitting, , No Blood in stool or Urine, No dysuria, No new skin rashes or bruises, No new joints pains-aches,  No recent weight gain or loss, No polyuria, polydypsia or polyphagia, No significant Mental Stressors.  A full 10 point Review of Systems was done, except as stated above, all other Review of  Systems were negative.   Social History History  Substance Use Topics  . Smoking status: Never Smoker   . Smokeless tobacco: Never Used  . Alcohol Use: No     Family History Family History  Problem Relation Age of Onset  . Heart disease Mother   . Heart disease Father   . HIV Father   . Hyperlipidemia Brother     Prior to Admission medications   Medication Sig Start Date End Date Taking? Authorizing Provider  aspirin 81 MG chewable tablet Chew 81 mg by mouth at bedtime.    Yes Historical Provider, MD  carbidopa-levodopa (SINEMET CR) 50-200 MG per tablet Take 1 tablet by mouth 3 (three) times daily. 1 tab in AM , 1 tab in the afternoon , and 1 tablet at bedtime   Yes Historical Provider, MD  fludrocortisone (FLORINEF) 0.1 MG tablet Take 0.1 mg by mouth 2 (two) times daily.   Yes Historical Provider, MD  midodrine (  PROAMATINE) 5 MG tablet Take 5 mg by mouth 2 (two) times daily with a meal.     Yes Historical Provider, MD  zinc oxide (BALMEX) 11.3 % CREA cream Apply 1 application topically daily. Applied to bedsores.   Yes Historical Provider, MD  Control Gel Formula Dressing (DUODERM CGF DRESSING) MISC Apply 1 each topically every other day as needed. 11/30/12   Gordy Savers, MD    Allergies  Allergen Reactions  . Ibuprofen     Facial swelling  . Sulfa Antibiotics     Unknown - happened as a baby    Physical Exam  Vitals  Blood pressure 167/100, pulse 68, temperature 99 F (37.2 C), temperature source Oral, resp. rate 22, SpO2 97.00%.   1. General chronically ill white American female lying in bed, looks tired  2. Normal affect and insight, Not Suicidal or Homicidal, Awake Alert, Oriented X 3.  3. No F.N deficits, ALL C.Nerves Intact, Strength 5/5 all 4 extremities, Sensation intact all 4 extremities, Plantars down going.  4. Ears and Eyes appear Normal, Conjunctivae clear, PERRLA. Moist Oral Mucosa.  5. Supple Neck, No JVD, No cervical lymphadenopathy  appriciated, No Carotid Bruits.  6. Symmetrical Chest wall movement, Good air movement bilaterally, CTAB.  7. RRR, No Gallops, Rubs or Murmurs, No Parasternal Heave.  8. Positive Bowel Sounds, Abdomen Soft, Non tender, No organomegaly appriciated,No rebound -guarding or rigidity.  9.  No Cyanosis, Normal Skin Turgor, No Skin Rash or Bruise.  10. Poor muscle tone,  joints appear normal , no effusions,  11. No Palpable Lymph Nodes in Neck or Axillae    Data Review  CBC  Recent Labs Lab 02/21/13 1550  WBC 6.3  HGB 12.9  HCT 37.8  PLT 147*  MCV 87.1  MCH 29.7  MCHC 34.1  RDW 13.0  LYMPHSABS 1.5  MONOABS 0.5  EOSABS 0.1  BASOSABS 0.0   ------------------------------------------------------------------------------------------------------------------  Chemistries   Recent Labs Lab 02/21/13 1720 02/21/13 1910  NA 140  --   K <2.0*  --   CL 95*  --   CO2 37*  --   GLUCOSE 94  --   BUN 12  --   CREATININE 0.38*  --   CALCIUM 8.3*  --   MG  --  1.8  AST 8  --   ALT <5  --   ALKPHOS 42  --   BILITOT 0.7  --    ------------------------------------------------------------------------------------------------------------------    ------------------------------------------------------------------------------------------------------------------ No components found with this basename: POCBNP,    ---------------------------------------------------------------------------------------------------------------  Urinalysis    Component Value Date/Time   COLORURINE YELLOW 02/21/2013 1708   APPEARANCEUR TURBID* 02/21/2013 1708   LABSPEC 1.019 02/21/2013 1708   PHURINE 7.5 02/21/2013 1708   GLUCOSEU NEGATIVE 02/21/2013 1708   HGBUR MODERATE* 02/21/2013 1708   BILIRUBINUR NEGATIVE 02/21/2013 1708   BILIRUBINUR neg 12/08/2011 1655   KETONESUR 15* 02/21/2013 1708   PROTEINUR 100* 02/21/2013 1708   UROBILINOGEN 1.0 02/21/2013 1708   UROBILINOGEN 0.2 12/08/2011 1655   NITRITE  NEGATIVE 02/21/2013 1708   NITRITE neg 12/08/2011 1655   LEUKOCYTESUR LARGE* 02/21/2013 1708    ----------------------------------------------------------------------------------------------------------------   Imaging results:   Dg Chest Portable 1 View  02/21/2013  *RADIOLOGY REPORT*  Clinical Data: Chest pain.  Shortness of breath.  PORTABLE CHEST - 1 VIEW  Comparison: Chest x-ray 10/19/2011.  Findings: Lung volumes are normal.  The linear opacity in the medial aspect of the left base may represent an area of subsegmental atelectasis  and/or scarring.  Pulmonary vasculature is within normal limits.  Heart size appears mildly enlarged, but is likely distorted by patient positioning which also slightly distorts the upper mediastinal contours.  Atherosclerosis in the thoracic aorta.  IMPRESSION: 1.  No radiographic evidence of acute cardiopulmonary disease. 2.  Possible mild cardiomegaly, likely accentuated by poor patient positioning. 3.  Atherosclerosis.   Original Report Authenticated By: Trudie Reed, M.D.       Assessment & Plan  1. hypokalemia, probably due to relative diarrhea 2. Urinary tract infection, probably, patient has a chronic indwelling catheter 3. Multisystem atrophy followed by neurology Dr. Cherylann Parr 4. Sacral decubitus ulcer 5. History of osteoporosis  Plan Replace potassium by IV. 4 potassium riders ordered in the emergency room and patient started on half-normal saline with potassium Check potassium level at 10 PM and call M.D. on call. Start IV Levaquin Check urine culture Consult neurology in a.m.   DVT Prophylaxis Lovenox  AM Labs Ordered, also please review Full Orders  Family Communication: Admission, patients condition and plan of care including tests being ordered have been discussed with the patient and brother who indicate understanding and agree with the plan and Code Status.  Code Status DO NOT RESUSCITATE  Disposition Plan: Home with home  health  Time spent in minutes : 45 minutes  Condition GUARDED

## 2013-02-21 NOTE — ED Notes (Signed)
Pt states that her Sinemet is not working anymore for her MSA, states she's having cramping pain all over body. Pt denies any other problems at this time. Pt has a foley catheter from home, states has had the past year d/t incontinence.

## 2013-02-21 NOTE — Telephone Encounter (Signed)
Pt was put in the hospital today. Pt's brother [name] would like for you to call him, Dr Kirtland Bouchard.

## 2013-02-21 NOTE — ED Notes (Signed)
[id number]<AV> Expected date:<BR> Expected time:<BR> Means of arrival:<BR> Comments:<BR> Shaking/ parkinsons

## 2013-02-21 NOTE — ED Notes (Signed)
Per EMS pt has hx of Parkinson's, has been having cramping/pain all over x 2 weeks, will be on medications that work for a while then they don't, also has a hx of MSA. Pt is from home. BP 146/90. HR 90, RR 16.

## 2013-02-22 DIAGNOSIS — G238 Other specified degenerative diseases of basal ganglia: Secondary | ICD-10-CM

## 2013-02-22 DIAGNOSIS — E876 Hypokalemia: Secondary | ICD-10-CM

## 2013-02-22 DIAGNOSIS — R5381 Other malaise: Secondary | ICD-10-CM

## 2013-02-22 DIAGNOSIS — N390 Urinary tract infection, site not specified: Principal | ICD-10-CM

## 2013-02-22 LAB — BASIC METABOLIC PANEL
BUN: 10 mg/dL (ref 6–23)
CO2: 38 mEq/L — ABNORMAL HIGH (ref 19–32)
Calcium: 8.2 mg/dL — ABNORMAL LOW (ref 8.4–10.5)
Creatinine, Ser: 0.44 mg/dL — ABNORMAL LOW (ref 0.50–1.10)
Glucose, Bld: 104 mg/dL — ABNORMAL HIGH (ref 70–99)
Sodium: 141 mEq/L (ref 135–145)

## 2013-02-22 MED ORDER — SODIUM CHLORIDE 0.45 % IV SOLN
INTRAVENOUS | Status: DC
  Administered 2013-02-22 – 2013-02-23 (×3): via INTRAVENOUS
  Filled 2013-02-22 (×6): qty 1000

## 2013-02-22 MED ORDER — MIDODRINE HCL 5 MG PO TABS
5.0000 mg | ORAL_TABLET | Freq: Two times a day (BID) | ORAL | Status: DC | PRN
  Filled 2013-02-22: qty 1

## 2013-02-22 MED ORDER — POTASSIUM CHLORIDE CRYS ER 20 MEQ PO TBCR
40.0000 meq | EXTENDED_RELEASE_TABLET | ORAL | Status: AC
  Administered 2013-02-22 (×2): 40 meq via ORAL
  Filled 2013-02-22 (×3): qty 2

## 2013-02-22 MED ORDER — ENOXAPARIN SODIUM 30 MG/0.3ML ~~LOC~~ SOLN
30.0000 mg | SUBCUTANEOUS | Status: DC
  Administered 2013-02-22 – 2013-02-24 (×3): 30 mg via SUBCUTANEOUS
  Filled 2013-02-22 (×4): qty 0.3

## 2013-02-22 MED ORDER — POLYETHYLENE GLYCOL 3350 17 G PO PACK
17.0000 g | PACK | Freq: Every day | ORAL | Status: DC
  Administered 2013-02-22: 17 g via ORAL
  Filled 2013-02-22 (×2): qty 1

## 2013-02-22 MED ORDER — POTASSIUM CHLORIDE CRYS ER 20 MEQ PO TBCR
40.0000 meq | EXTENDED_RELEASE_TABLET | Freq: Three times a day (TID) | ORAL | Status: DC
  Administered 2013-02-22 (×3): 40 meq via ORAL
  Filled 2013-02-22 (×6): qty 2

## 2013-02-22 MED ORDER — FLUDROCORTISONE ACETATE 0.1 MG PO TABS
0.1000 mg | ORAL_TABLET | Freq: Every day | ORAL | Status: DC
  Filled 2013-02-22: qty 1

## 2013-02-22 MED ORDER — JUVEN PO PACK
1.0000 | PACK | Freq: Two times a day (BID) | ORAL | Status: DC
  Administered 2013-02-22 – 2013-02-24 (×3): 1 via ORAL
  Filled 2013-02-22 (×7): qty 1

## 2013-02-22 MED ORDER — ADULT MULTIVITAMIN W/MINERALS CH
1.0000 | ORAL_TABLET | Freq: Every day | ORAL | Status: DC
  Administered 2013-02-22 – 2013-02-25 (×4): 1 via ORAL
  Filled 2013-02-22 (×4): qty 1

## 2013-02-22 MED ORDER — ENSURE COMPLETE PO LIQD
237.0000 mL | Freq: Two times a day (BID) | ORAL | Status: DC
  Administered 2013-02-22 – 2013-02-24 (×5): 237 mL via ORAL

## 2013-02-22 NOTE — Consult Note (Signed)
WOC consult Note Reason for Consult: Consult requested for sacrum pressure ulcer. Pt is immobile and emeciated with protruding sacral bone. Wound type: Stage 2  Pressure Ulcer POA: Yes Measurement: 1X1X.1cm Wound bed: dry red Drainage (amount, consistency, odor) no odor or drainage Periwound: Intact skin surrounding. Dressing procedure/placement/frequency: Foam dressing to protect from shear and promote healing. Discussed preventive measures for pressure ulcers and pt appears to be well informed on this subject. Will not plan to follow further unless re-consulted.  [address], RN, MSN, [employer]  [phone]

## 2013-02-22 NOTE — Progress Notes (Signed)
Clinical Social Work Department BRIEF PSYCHOSOCIAL ASSESSMENT 02/22/2013  Patient:  [name], [name]     Account Number:  [id number]     Admit date:  02/21/2013  Clinical Social Worker:  Orpah Greek  Date/Time:  02/22/2013 02:13 PM  Referred by:  Physician  Date Referred:  02/22/2013 Referred for  SNF Placement   Other Referral:   Interview type:  Patient Other interview type:   and brother, [name]    PSYCHOSOCIAL DATA Living Status:  SIBLING Admitted from facility:   Level of care:   Primary support name:  [name] (brother) c#: [phone] Primary support relationship to patient:  SIBLING Degree of support available:   good    CURRENT CONCERNS Current Concerns  Post-Acute Placement   Other Concerns:    SOCIAL WORK ASSESSMENT / PLAN CSW received consult that patient and brother have been looking into SNF placement for patient.   Assessment/plan status:  Information/Referral to [facility] Other assessment/ plan:   Information/referral to community resources:   CSW completed FL2 and faxed information out to [facility] - provided list of facilities & bed offers as of 02/22/13 @ 2pm.    PATIENT'S/FAMILY'S RESPONSE TO PLAN OF CARE: Patient & brother were hoping that [facility] or [name] would have a bed available for her at discharge. CSW have left messages with both facilities (neither have beds available as of today), will check back Monday.        Unice Bailey, LCSW [facility] Clinical Social Worker cell #: [phone]

## 2013-02-22 NOTE — Progress Notes (Signed)
[facility] PROGRESS NOTE  [name] BMW:[id number] DOB: Aug 13, 1956 DOA: 02/21/2013 PCP: Rogelia Boga, MD  Assessment/Plan: 1. hypokalemia - due to Florinef, poor PO intake - relative diarrhea intermittently could be contributing as well - replace in IVF, IV and PO TID for now  2. Urinary tract infection vs colonization - continue levaquin, FU urine Cx - change Foley  3. Multisystem atrophy followed by neurology Dr. Marjory Lies now - progressive disease without treatment options - needs rehab now   4. Sacral decubitus ulcer  - wound care  5. History of osteoporosis     Code Status: DNR Family Communication: d/w pt and brother at bedside Disposition Plan: they want to pursue SNF, CSW notified    Antibiotics:  Levaquin 2/27  HPI/Subjective: No changes, has progressive neurological disorder with increasing weakness for 2-3 weeks  Objective: Filed Vitals:   02/21/13 1900 02/21/13 1930 02/21/13 2050 02/22/13 0646  BP: 167/100 175/115 153/112 160/96  Pulse: 68 65 70 67  Temp:   98.8 F (37.1 C) 97.8 F (36.6 C)  TempSrc:   Oral Oral  Resp: 22 19 16 20   Height:   5\' 2"  (1.575 m)   Weight:   42.1 kg (92 lb 13 oz)   SpO2: 97% 97% 97% 99%    Intake/Output Summary (Last 24 hours) at 02/22/13 1104 Last data filed at 02/22/13 0900  Gross per 24 hour  Intake    360 ml  Output   1660 ml  Net  -1300 ml   Filed Weights   02/21/13 2050  Weight: 42.1 kg (92 lb 13 oz)    Exam:   General: AAOx3  Cardiovascular: S1S2/rrr  Respiratory: CTAB  Abdomen: soft, NT, BS present  Data Reviewed: Basic Metabolic Panel:  Recent Labs Lab 02/21/13 1720 02/21/13 1910 02/22/13 0136  NA 140  --  141  K <2.0*  --  2.3*  CL 95*  --  96  CO2 37*  --  38*  GLUCOSE 94  --  104*  BUN 12  --  10  CREATININE 0.38*  --  0.44*  CALCIUM 8.3*  --  8.2*  MG  --  1.8  --    Liver Function Tests:  Recent Labs Lab 02/21/13 1720  AST 8  ALT <5  ALKPHOS 42   BILITOT 0.7  PROT 5.6*  ALBUMIN 3.3*   No results found for this basename: LIPASE, AMYLASE,  in the last 168 hours No results found for this basename: AMMONIA,  in the last 168 hours CBC:  Recent Labs Lab 02/21/13 1550  WBC 6.3  NEUTROABS 4.1  HGB 12.9  HCT 37.8  MCV 87.1  PLT 147*   Cardiac Enzymes:  Recent Labs Lab 02/21/13 1720  CKTOTAL 33  TROPONINI <0.30   BNP (last 3 results) No results found for this basename: PROBNP,  in the last 8760 hours CBG: No results found for this basename: GLUCAP,  in the last 168 hours  No results found for this or any previous visit (from the past 240 hour(s)).   Studies: Dg Chest Portable 1 View  02/21/2013  *RADIOLOGY REPORT*  Clinical Data: Chest pain.  Shortness of breath.  PORTABLE CHEST - 1 VIEW  Comparison: Chest x-ray 10/19/2011.  Findings: Lung volumes are normal.  The linear opacity in the medial aspect of the left base may represent an area of subsegmental atelectasis and/or scarring.  Pulmonary vasculature is within normal limits.  Heart size appears mildly enlarged, but is likely distorted  by patient positioning which also slightly distorts the upper mediastinal contours.  Atherosclerosis in the thoracic aorta.  IMPRESSION: 1.  No radiographic evidence of acute cardiopulmonary disease. 2.  Possible mild cardiomegaly, likely accentuated by poor patient positioning. 3.  Atherosclerosis.   Original Report Authenticated By: Trudie Reed, M.D.     Scheduled Meds: . aspirin  81 mg Oral QHS  . carbidopa-levodopa  1 tablet Oral TID  . enoxaparin (LOVENOX) injection  30 mg Subcutaneous Q24H  . fludrocortisone  0.1 mg Oral BID  . levofloxacin (LEVAQUIN) IV  250 mg Intravenous Q24H  . midodrine  5 mg Oral BID WC  . polyethylene glycol  17 g Oral Daily  . sodium chloride  3 mL Intravenous Q12H   Continuous Infusions: . sodium chloride 0.45 % with kcl 100 mL/hr at 02/22/13 4782    Active Problems:   Urinary tract infection    Sacral decubitus ulcer    Time spent:    [facility]  [facility] Pager [phone]. If 8PM-8AM, please contact night-coverage at [email or web address], password [facility] 02/22/2013, 11:04 AM  LOS: 1 day

## 2013-02-22 NOTE — Progress Notes (Signed)
   CARE MANAGEMENT NOTE 02/22/2013  Patient:  [name], [name]   Account Number:  [id number]  Date Initiated:  02/22/2013  Documentation initiated by:  Jiles Crocker  Subjective/Objective Assessment:   ADMITTED WITH UTI, HYPOKALEMIA     Action/Plan:   PCP IS DR Rogelia Boga, MD  LIVES AT HOME WITH FAMILY MEMBERS; PATIENT IS ACTIVE WITH [facility] FOR MONTHLY NURSING VISITS;   Anticipated DC Date:  02/26/2013   Anticipated DC Plan:  HOME W HOME HEALTH SERVICES      DC Planning Services  CM consult         Status of service:  In process, will continue to follow Medicare Important Message given?  NA - LOS <3 / Initial given by admissions (If response is "NO", the following Medicare IM given date fields will be blank) Per UR Regulation:  Reviewed for med. necessity/level of care/duration of stay Comments:  02/22/2013- B Karon Heckendorn RN,BSN,MHA

## 2013-02-22 NOTE — Progress Notes (Signed)
[facility]  Patient Status: Active (receiving services up to time of hospitalization)  [facility] is providing the following services: RN  If patient discharges after hours, please call [phone].   [name] 02/22/2013, 10:28 AM

## 2013-02-22 NOTE — Evaluation (Signed)
Physical Therapy Evaluation Patient Details Name: [name] MRN: [id number] DOB: 1956-01-03 Today's Date: 02/22/2013 Time: 4098-1191 PT Time Calculation (min): 20 min  PT Assessment / Plan / Recommendation Clinical Impression  Pt admitted with hypokalemia, UTI, and multiple system atrophy.  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  Pt agreeable to physical therapy to assist with bed mobility and maintaining ROM and strength to prepare for d/c to SNF.    PT Assessment  Patient needs continued PT services    Follow Up Recommendations  SNF;Supervision/Assistance - 24 hour    Does the patient have the potential to tolerate intense rehabilitation      Barriers to Discharge        Equipment Recommendations  None recommended by PT    Recommendations for Other Services     Frequency Min 2X/week    Precautions / Restrictions Precautions Precautions: Fall Precaution Comments: multiple system atrophy - progressive   Pertinent Vitals/Pain N/a, positioned to comfort      Mobility  Bed Mobility Bed Mobility: Rolling Left;Rolling Right;Supine to Sit;Sit to Supine Rolling Right: 1: +1 Total assist Rolling Left: 1: +1 Total assist Supine to Sit: 1: +2 Total assist Supine to Sit: Patient Percentage: 0% Sit to Supine: 1: +2 Total assist Sit to Supine: Patient Percentage: 0% Details for Bed Mobility Assistance: verbal cues for technique, pt unable to assist, pt was only able to reach arm across body to prepare for rolling Transfers Transfers: Not assessed    Exercises     PT Diagnosis: Generalized weakness  PT Problem List: Decreased strength;Decreased range of motion;Decreased activity tolerance;Decreased mobility;Decreased balance PT Treatment Interventions: DME instruction;Gait training;Functional mobility training;Therapeutic activities;Therapeutic exercise;Balance training;Neuromuscular re-education;Patient/family  education   PT Goals Acute Rehab PT Goals PT Goal Formulation: With patient Time For Goal Achievement: 03/01/13 Potential to Achieve Goals: Fair Pt will Roll Supine to Right Side: with max assist PT Goal: Rolling Supine to Right Side - Progress: Goal set today Pt will Roll Supine to Left Side: with max assist PT Goal: Rolling Supine to Left Side - Progress: Goal set today Pt will go Supine/Side to Sit: with max assist PT Goal: Supine/Side to Sit - Progress: Goal set today Pt will Sit at Edge of Bed: with supervision;1-2 min;with bilateral upper extremity support PT Goal: Sit at Edge Of Bed - Progress: Goal set today Pt will go Sit to Supine/Side: with max assist PT Goal: Sit to Supine/Side - Progress: Goal set today  Visit Information  Last PT Received On: 02/22/13 Assistance Needed: +2    Subjective Data  Subjective: On a good day I can.  (feed herself)   Prior Functioning  Home Living Lives With: Family Available Help at Discharge: Skilled Nursing Facility Additional Comments: Pt and brother looking into SNF options upon d/c. Prior Function Level of Independence: Needs assistance Needs Assistance: Feeding;Dressing;Bathing;Grooming;Toileting;Transfers Toileting: Total Transfer Assistance: requiring more assist recently Able to Take Stairs?: No Communication Communication: No difficulties    Cognition  Cognition Overall Cognitive Status: Appears within functional limits for tasks assessed/performed Arousal/Alertness: Awake/alert Orientation Level: Appears intact for tasks assessed Behavior During Session: [facility] for tasks performed    Extremity/Trunk Assessment Right Upper Extremity Assessment RUE ROM/Strength/Tone: Deficits RUE ROM/Strength/Tone Deficits: active elbow flexion and extension, shoulder flexion approx 100*, poor grip Left Upper Extremity Assessment LUE ROM/Strength/Tone: Deficits LUE ROM/Strength/Tone Deficits: active elbow flexion and extension, active  shoulder flexion approx 100*, poor grip Right Lower Extremity  Assessment RLE ROM/Strength/Tone: Deficits RLE ROM/Strength/Tone Deficits: able to perform DF/PF actively, 2+/5 hip flexion, 1/5 hip abduction, assist for heels slides and hip abduction/adduction required, passively able to achieve at least 80* hip and knee flexion Left Lower Extremity Assessment LLE ROM/Strength/Tone: Deficits LLE ROM/Strength/Tone Deficits: able to perform DF/PF actively, 2+/5 hip flexion, 1/5 hip abduction, assist for heels slides and hip abduction/adduction required,  passively able to achieve at lease 90* hip and knee flexion   Balance Balance Balance Assessed: Yes Static Sitting Balance Static Sitting - Balance Support: Feet supported;Bilateral upper extremity supported Static Sitting - Level of Assistance: 4: Min assist Static Sitting - Comment/# of Minutes: pt reports she can sit with supervision only on a very good day, only able to tolerate 1-2 minutes sitting EOB  End of Session PT - End of Session Activity Tolerance: Patient limited by fatigue Patient left: in bed;with call bell/phone within reach;with family/visitor present  GP     Caylan Schifano,KATHrine E 02/22/2013, 4:37 PM Zenovia Jarred, PT, DPT 02/22/2013 Pager: [phone]

## 2013-02-22 NOTE — Progress Notes (Signed)
Notified Dr. Jomarie Longs about patient's persistent elevated bp. Will not be giving midodrine and order will be changing to PRN. Will continue to closely monitor the patient. Erskin Burnet RN

## 2013-02-22 NOTE — Progress Notes (Signed)
Patient & brother were hoping that [facility] or [facility] would have a bed available for her at discharge. CSW have left messages with both facilities (neither have beds available as of today), will check back Monday.    Clinical Social Work Department CLINICAL SOCIAL WORK PLACEMENT NOTE 02/22/2013  Patient:  [name], [name]  Account Number:  [id number] Admit date:  02/21/2013  Clinical Social Worker:  Orpah Greek  Date/time:  02/22/2013 02:16 PM  Clinical Social Work is seeking post-discharge placement for this patient at the following level of care:   SKILLED NURSING   (*CSW will update this form in Epic as items are completed)   02/22/2013  Patient/family provided with [facility] Department of Clinical Social Work's list of facilities offering this level of care within the geographic area requested by the patient (or if unable, by the patient's family).  02/22/2013  Patient/family informed of their freedom to choose among providers that offer the needed level of care, that participate in Medicare, Medicaid or managed care program needed by the patient, have an available bed and are willing to accept the patient.  02/22/2013  Patient/family informed of [facility]' ownership interest in [facility], as well as of the fact that they are under no obligation to receive care at this facility.  PASARR submitted to EDS on 02/22/2013 PASARR number received from [employer] on 02/22/2013  FL2 transmitted to all facilities in geographic area requested by pt/family on  02/22/2013 FL2 transmitted to all facilities within larger geographic area on   Patient informed that his/her managed care company has contracts with or will negotiate with  certain facilities, including the following:     Patient/family informed of bed offers received:  02/22/2013 Patient chooses bed at  Physician recommends and patient chooses bed at    Patient to be transferred to  on   Patient to be transferred  to facility by   The following physician request were entered in Epic:   Additional Comments:  Unice Bailey, LCSW [facility] Clinical Social Worker cell #: [phone]

## 2013-02-22 NOTE — Progress Notes (Signed)
CRITICAL VALUE ALERT  Critical value received:  Potassium 2.3  Date of notification:  02/22/13  Time of notification:  03:41  Critical value read back:yes  Nurse who received alert:  Christell Faith, RN  MD notified (1st page):  Craige Cotta  Time of first page:  03:41  MD notified (2nd page):  Time of second page:  Responding MD:  Craige Cotta  Time MD responded:  03:48   MD responded with order to give 40 mEq Potassium po Q4H X2. Will administer and continue to monitor pt.  - Christell Faith, RN

## 2013-02-22 NOTE — Progress Notes (Signed)
INITIAL NUTRITION ASSESSMENT  DOCUMENTATION CODES Per approved criteria  -Underweight   INTERVENTION: Provide Ensure Complete po BID, each supplement provides 350 kcal and 13 grams of protein. Provide Juven BID  Provide daily Multivitamin   NUTRITION DIAGNOSIS: Increased nutrient needs related to wound healing as evidenced by stage 2 pressure ulcer on sacrum.   Inadequate oral intake related to decreased appetite with current illness as evidenced by pt report and po intake 50% of meals.  Goal: Pt to meet >/= 90% of their estimated nutrition needs   Monitor:  Po intake Wt  Reason for Assessment: Low BMI  57 y.o. female  Admitting Dx: Hypokalemia and UTI  ASSESSMENT: 57 y.o. female, with past medical history significant for multisystem atrophy, followed by neurology, and was taking care of by home health and her own family, presenting today with progressive weakness over the last few days. Pt reports that she weighed 110-115 lbs 3 years ago but, has been maintaining wt recently around 95 lbs. Pt reports decreased appetite with current illness and eating 50% of meals. Pt states appetite is usually fair. Per RN pt needs assistance eating meals.    Height: Ht Readings from Last 1 Encounters:  02/21/13 5\' 2"  (1.575 m)    Weight: Wt Readings from Last 1 Encounters:  02/21/13 92 lb 13 oz (42.1 kg)    Ideal Body Weight: 110 lb  % Ideal Body Weight: 84%  Wt Readings from Last 10 Encounters:  02/21/13 92 lb 13 oz (42.1 kg)  09/03/12 100 lb (45.36 kg)  05/29/12 99 lb (44.906 kg)  11/08/11 100 lb (45.36 kg)    Usual Body Weight: 95 lb  % Usual Body Weight: 97%  BMI:  Body mass index is 16.97 kg/(m^2).  Estimated Nutritional Needs: Kcal: 1285-1500 Protein: 63-76 grams Fluid: >1.8 L/day  Skin: Stage 2 pressure ulcer on sacrum  Diet Order: General  EDUCATION NEEDS: -No education needs identified at this time   Intake/Output Summary (Last 24 hours) at  02/22/13 1307 Last data filed at 02/22/13 0900  Gross per 24 hour  Intake    360 ml  Output   1660 ml  Net  -1300 ml    Last BM: 2/24  Labs:   Recent Labs Lab 02/21/13 1720 02/21/13 1910 02/22/13 0136  NA 140  --  141  K <2.0*  --  2.3*  CL 95*  --  96  CO2 37*  --  38*  BUN 12  --  10  CREATININE 0.38*  --  0.44*  CALCIUM 8.3*  --  8.2*  MG  --  1.8  --   GLUCOSE 94  --  104*    CBG (last 3)  No results found for this basename: GLUCAP,  in the last 72 hours  Scheduled Meds: . aspirin  81 mg Oral QHS  . carbidopa-levodopa  1 tablet Oral TID  . enoxaparin (LOVENOX) injection  30 mg Subcutaneous Q24H  . [START ON 02/23/2013] fludrocortisone  0.1 mg Oral Daily  . levofloxacin (LEVAQUIN) IV  250 mg Intravenous Q24H  . midodrine  5 mg Oral BID WC  . polyethylene glycol  17 g Oral Daily  . potassium chloride  40 mEq Oral TID  . sodium chloride  3 mL Intravenous Q12H    Continuous Infusions: . sodium chloride 0.45 % with kcl 100 mL/hr at 02/22/13 1610    Past Medical History  Diagnosis Date  . Multiple system atrophy     Followed  [facility] in movement disorder clinic-Dr Rubin Payor  . Osteoporosis   . Arthritis     Knees affected  . Parkinson disease     Past Surgical History  Procedure Laterality Date  . 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 Tonsillectomy    . Refractive surgery    . Neuroendoscopic placement / replacement ventricular catheter w/ attachment shunt / external drain      May 2012    Ian Malkin RD, LDN Inpatient Clinical Dietitian Pager: [phone] After Hours Pager: [phone]

## 2013-02-23 LAB — BASIC METABOLIC PANEL
BUN: 7 mg/dL (ref 6–23)
CO2: 30 mEq/L (ref 19–32)
Calcium: 8.7 mg/dL (ref 8.4–10.5)
Creatinine, Ser: 0.43 mg/dL — ABNORMAL LOW (ref 0.50–1.10)
GFR calc non Af Amer: >90 mL/min (ref >90)
Glucose, Bld: 103 mg/dL — ABNORMAL HIGH (ref 70–99)
Sodium: 143 mEq/L (ref 135–145)

## 2013-02-23 LAB — URINE CULTURE: Special Requests: NORMAL

## 2013-02-23 MED ORDER — HYDRALAZINE HCL 20 MG/ML IJ SOLN: 5.0000 mg | Freq: Four times a day (QID) | INTRAMUSCULAR | Status: DC | PRN

## 2013-02-23 MED ORDER — LEVOFLOXACIN 250 MG PO TABS
250.0000 mg | ORAL_TABLET | ORAL | Status: DC
  Administered 2013-02-23 – 2013-02-24 (×2): 250 mg via ORAL
  Filled 2013-02-23 (×3): qty 1

## 2013-02-23 MED ORDER — POLYETHYLENE GLYCOL 3350 17 G PO PACK
17.0000 g | PACK | Freq: Two times a day (BID) | ORAL | Status: DC
  Filled 2013-02-23 (×3): qty 1

## 2013-02-23 NOTE — Progress Notes (Signed)
[facility] PROGRESS NOTE  [name] GNF:[id number] DOB: [birth year] DOA: 02/21/2013 PCP: Rogelia Boga, MD  Assessment/Plan: 1. hypokalemia - due to Florinef, poor PO intake - relative diarrhea intermittently could be contributing as well - replaced and normal today - bmet in am  2. H/o Orthostatic hypotension/autonomic neuropathy - BP fluctuates, hold midodrine and florinef now  3. Urinary tract infection vs colonization - continue levaquin Day 2, FU urine Cx - changed Foley 2/28  4. Multisystem atrophy followed by neurology Dr. Marjory Lies now - progressive disease without treatment options - needs rehab now   5. Sacral decubitus ulcer  - wound care  6. History of osteoporosis   7. Constipation: change miralax to BID    Code Status: DNR Family Communication: d/w pt and brother at bedside Disposition Plan: they want to pursue SNF, CSW notified    Antibiotics:  Levaquin 2/27  HPI/Subjective: Feels ok, no new complaints  Objective: Filed Vitals:   02/22/13 2045 02/22/13 2100 02/23/13 0630 02/23/13 0810  BP: 158/96 161/102 172/105 178/104  Pulse:   74 71  Temp:  98.1 F (36.7 C) 98 F (36.7 C) 98.4 F (36.9 C)  TempSrc:  Oral Oral Oral  Resp:  20 18 22   Height:      Weight:      SpO2:  99% 99% 100%    Intake/Output Summary (Last 24 hours) at 02/23/13 0951 Last data filed at 02/23/13 0800  Gross per 24 hour  Intake 2909.99 ml  Output   3475 ml  Net -565.01 ml   Filed Weights   02/21/13 2050  Weight: 42.1 kg (92 lb 13 oz)    Exam:   General: AAOx3  Cardiovascular: S1S2/rrr  Respiratory: CTAB  Abdomen: soft, NT, BS present  Neuro: decreased muscle tone, weakness in both lower ext 2/5, upper ext 3/5, decreased reflexes  Data Reviewed: Basic Metabolic Panel:  Recent Labs Lab 02/21/13 1720 02/21/13 1910 02/22/13 0136 02/22/13 1344 02/23/13 0521  NA 140  --  141  --  143  K <2.0*  --  2.3* 3.2* 4.9  CL 95*  --  96   --  110  CO2 37*  --  38*  --  30  GLUCOSE 94  --  104*  --  103*  BUN 12  --  10  --  7  CREATININE 0.38*  --  0.44*  --  0.43*  CALCIUM 8.3*  --  8.2*  --  8.7  MG  --  1.8  --   --   --    Liver Function Tests:  Recent Labs Lab 02/21/13 1720  AST 8  ALT <5  ALKPHOS 42  BILITOT 0.7  PROT 5.6*  ALBUMIN 3.3*   No results found for this basename: LIPASE, AMYLASE,  in the last 168 hours No results found for this basename: AMMONIA,  in the last 168 hours CBC:  Recent Labs Lab 02/21/13 1550  WBC 6.3  NEUTROABS 4.1  HGB 12.9  HCT 37.8  MCV 87.1  PLT 147*   Cardiac Enzymes:  Recent Labs Lab 02/21/13 1720  CKTOTAL 33  TROPONINI <0.30   BNP (last 3 results) No results found for this basename: PROBNP,  in the last 8760 hours CBG: No results found for this basename: GLUCAP,  in the last 168 hours  Recent Results (from the past 240 hour(s))  URINE CULTURE     Status: None   Collection Time    02/21/13  5:08 PM  Result Value Range Status   Specimen Description PENDING   Incomplete   Special Requests PENDING   Incomplete   Culture  Setup Time 02/22/2013 03:27   Final   Colony Count >=100,000 COLONIES/ML   Final   Culture     Final   Value: Multiple bacterial morphotypes present, none predominant. Suggest appropriate recollection if clinically indicated.   Report Status PENDING   Incomplete     Studies: Dg Chest Portable 1 View  02/21/2013  *RADIOLOGY REPORT*  Clinical Data: Chest pain.  Shortness of breath.  PORTABLE CHEST - 1 VIEW  Comparison: Chest x-ray 10/19/2011.  Findings: Lung volumes are normal.  The linear opacity in the medial aspect of the left base may represent an area of subsegmental atelectasis and/or scarring.  Pulmonary vasculature is within normal limits.  Heart size appears mildly enlarged, but is likely distorted by patient positioning which also slightly distorts the upper mediastinal contours.  Atherosclerosis in the thoracic aorta.   IMPRESSION: 1.  No radiographic evidence of acute cardiopulmonary disease. 2.  Possible mild cardiomegaly, likely accentuated by poor patient positioning. 3.  Atherosclerosis.   Original Report Authenticated By: Trudie Reed, M.D.     Scheduled Meds: . aspirin  81 mg Oral QHS  . carbidopa-levodopa  1 tablet Oral TID  . enoxaparin (LOVENOX) injection  30 mg Subcutaneous Q24H  . feeding supplement  237 mL Oral BID BM  . levofloxacin (LEVAQUIN) IV  250 mg Intravenous Q24H  . multivitamin with minerals  1 tablet Oral Daily  . nutrition supplement  1 packet Oral BID BM  . polyethylene glycol  17 g Oral Daily  . sodium chloride  3 mL Intravenous Q12H   Continuous Infusions: . sodium chloride 0.45 % with kcl 100 mL/hr at 02/23/13 0202    Active Problems:   Urinary tract infection   Sacral decubitus ulcer    Time spent:    [facility]  [facility] Pager [phone]. If 8PM-8AM, please contact night-coverage at [email or web address], password [facility] 02/23/2013, 9:51 AM  LOS: 2 days

## 2013-02-24 LAB — BASIC METABOLIC PANEL
BUN: 19 mg/dL (ref 6–23)
Calcium: 8.9 mg/dL (ref 8.4–10.5)
Creatinine, Ser: 0.49 mg/dL — ABNORMAL LOW (ref 0.50–1.10)
GFR calc Af Amer: >90 mL/min (ref >90)
GFR calc non Af Amer: >90 mL/min (ref >90)
Glucose, Bld: 98 mg/dL (ref 70–99)
Potassium: 3.9 mEq/L (ref 3.5–5.1)

## 2013-02-24 MED ORDER — POTASSIUM CHLORIDE CRYS ER 20 MEQ PO TBCR
20.0000 meq | EXTENDED_RELEASE_TABLET | Freq: Every day | ORAL | Status: DC
  Administered 2013-02-24 – 2013-02-25 (×2): 20 meq via ORAL
  Filled 2013-02-24 (×3): qty 1

## 2013-02-24 NOTE — Progress Notes (Signed)
[facility] PROGRESS NOTE  [name] ZOX:[id number] DOB: Dec 25, 1956 DOA: 02/21/2013 PCP: Rogelia Boga, MD  Assessment/Plan: 1. hypokalemia - due to Florinef, poor PO intake - relative diarrhea intermittently could be contributing as well - replaced and normal today - bmet in am  2. H/o Orthostatic hypotension/autonomic neuropathy - BP fluctuates, hold midodrine and florinef now  3. Urinary tract infection vs colonization - continue levaquin Day 3, FU urine Cx - changed Foley 2/28  4. Multisystem atrophy followed by neurology Dr. Marjory Lies now - progressive disease without treatment options - needs rehab now   5. Sacral decubitus ulcer  - wound care  6. History of osteoporosis   7. Constipation: resolved, stop miralax     Code Status: DNR Family Communication: d/w pt and brother at bedside Disposition Plan: SNF, ? Monday   Antibiotics:  Levaquin 2/27  HPI/Subjective: Feels ok, no new complaints, had multiple BMS since yesterday after miralax  Objective: Filed Vitals:   02/23/13 0810 02/23/13 1407 02/23/13 2234 02/24/13 0530  BP: 178/104 107/70 147/90 147/89  Pulse: 71 80 78 72  Temp: 98.4 F (36.9 C) 98.7 F (37.1 C) 98.6 F (37 C) 98.4 F (36.9 C)  TempSrc: Oral Oral Oral Oral  Resp: 22 18 18 18   Height:      Weight:      SpO2: 100% 100% 100% 96%    Intake/Output Summary (Last 24 hours) at 02/24/13 0936 Last data filed at 02/24/13 0837  Gross per 24 hour  Intake    900 ml  Output    925 ml  Net    -25 ml   Filed Weights   02/21/13 2050  Weight: 42.1 kg (92 lb 13 oz)    Exam:   General: AAOx3  Cardiovascular: S1S2/rrr  Respiratory: CTAB  Abdomen: soft, NT, BS present  Neuro: decreased muscle tone, weakness in both lower ext 2/5, upper ext 3/5, decreased reflexes  Data Reviewed: Basic Metabolic Panel:  Recent Labs Lab 02/21/13 1720 02/21/13 1910 02/22/13 0136 02/22/13 1344 02/23/13 0521 02/24/13 0516  NA  140  --  141  --  143 141  K <2.0*  --  2.3* 3.2* 4.9 3.9  CL 95*  --  96  --  110 105  CO2 37*  --  38*  --  30 28  GLUCOSE 94  --  104*  --  103* 98  BUN 12  --  10  --  7 19  CREATININE 0.38*  --  0.44*  --  0.43* 0.49*  CALCIUM 8.3*  --  8.2*  --  8.7 8.9  MG  --  1.8  --   --   --   --    Liver Function Tests:  Recent Labs Lab 02/21/13 1720  AST 8  ALT <5  ALKPHOS 42  BILITOT 0.7  PROT 5.6*  ALBUMIN 3.3*   No results found for this basename: LIPASE, AMYLASE,  in the last 168 hours No results found for this basename: AMMONIA,  in the last 168 hours CBC:  Recent Labs Lab 02/21/13 1550  WBC 6.3  NEUTROABS 4.1  HGB 12.9  HCT 37.8  MCV 87.1  PLT 147*   Cardiac Enzymes:  Recent Labs Lab 02/21/13 1720  CKTOTAL 33  TROPONINI <0.30   BNP (last 3 results) No results found for this basename: PROBNP,  in the last 8760 hours CBG: No results found for this basename: GLUCAP,  in the last 168 hours  Recent Results (from  the past 240 hour(s))  URINE CULTURE     Status: None   Collection Time    02/21/13  5:08 PM      Result Value Range Status   Specimen Description PENDING   Incomplete   Special Requests PENDING   Incomplete   Culture  Setup Time 02/22/2013 03:27   Final   Colony Count >=100,000 COLONIES/ML   Final   Culture     Final   Value: Multiple bacterial morphotypes present, none predominant. Suggest appropriate recollection if clinically indicated.   Report Status PENDING   Incomplete  URINE CULTURE     Status: None   Collection Time    02/22/13  7:30 AM      Result Value Range Status   Specimen Description URINE, CATHETERIZED   Final   Special Requests Normal   Final   Culture  Setup Time 02/22/2013 12:47   Final   Colony Count >=100,000 COLONIES/ML   Final   Culture     Final   Value: Multiple bacterial morphotypes present, none predominant. Suggest appropriate recollection if clinically indicated.   Report Status 02/23/2013 FINAL   Final      Studies: No results found.  Scheduled Meds: . aspirin  81 mg Oral QHS  . carbidopa-levodopa  1 tablet Oral TID  . enoxaparin (LOVENOX) injection  30 mg Subcutaneous Q24H  . feeding supplement  237 mL Oral BID BM  . levofloxacin  250 mg Oral Q24H  . multivitamin with minerals  1 tablet Oral Daily  . nutrition supplement  1 packet Oral BID BM  . potassium chloride  20 mEq Oral Daily  . sodium chloride  3 mL Intravenous Q12H   Continuous Infusions:    Active Problems:   Urinary tract infection   Sacral decubitus ulcer    Time spent:    [facility]  [facility] Pager [phone]. If 8PM-8AM, please contact night-coverage at [email or web address], password [facility] 02/24/2013, 9:36 AM  LOS: 3 days

## 2013-02-25 LAB — BASIC METABOLIC PANEL
BUN: 22 mg/dL (ref 6–23)
Calcium: 9.1 mg/dL (ref 8.4–10.5)
Creatinine, Ser: 0.52 mg/dL (ref 0.50–1.10)
GFR calc Af Amer: >90 mL/min (ref >90)
GFR calc non Af Amer: >90 mL/min (ref >90)

## 2013-02-25 LAB — URINE CULTURE

## 2013-02-25 MED ORDER — ADULT MULTIVITAMIN W/MINERALS CH: 1.0000 | ORAL_TABLET | Freq: Every day | ORAL | Status: DC

## 2013-02-25 MED ORDER — POTASSIUM CHLORIDE CRYS ER 20 MEQ PO TBCR: 20.0000 meq | EXTENDED_RELEASE_TABLET | Freq: Every day | ORAL | Status: DC

## 2013-02-25 MED ORDER — JUVEN PO PACK: 1.0000 | PACK | Freq: Two times a day (BID) | ORAL | Status: DC

## 2013-02-25 MED ORDER — FLUDROCORTISONE ACETATE 0.1 MG PO TABS: 0.1000 mg | ORAL_TABLET | Freq: Two times a day (BID) | ORAL | Status: DC | PRN

## 2013-02-25 NOTE — Progress Notes (Signed)
Discharged to [facility] pick up by transport. D/c with foley catheter inplace, per patient she has the [facility] in for 9months now.  no complaints of any pain  or discomfort upon discharged. PIV removed no s/s of any infiltration or swelling on insertion site.

## 2013-02-25 NOTE — Discharge Summary (Signed)
Physician Discharge Summary  [name] UJW:[id number] DOB: Jan 15, 1956 DOA: 02/21/2013  PCP: Rogelia Boga, MD  Admit date: 02/21/2013 Discharge date: 02/25/2013  Time spent: 45 minutes  Recommendations for Outpatient Follow-up:  1. PCP in 1 week 2. Dr.Penumalli in 2 weeks  Discharge Diagnoses:   -Multiple System Atrophy  -Urinary tract infection vs colonization - Neurogenic bladder with indwelling foley catheter - Sacral decubitus ulcers - Autonomic neuropathy - Labile BP - h/o Orthostatic hypotension - Hypokalemia   Discharge Condition: stable  Diet recommendation: regular  Filed Weights   02/21/13 2050  Weight: 42.1 kg (92 lb 13 oz)    History of present illness:  [name] is a 57 y.o. female, with past medical history significant for multisystem atrophy which is a progressive neurological disorder causing significant debility, was taken care of by home health and her own family, presented to ER with progressive weakness over the last few days. The patient reports relative diarrhea. She only has a bowel movement every 4 days and her last bowel movement was very soft. No reports of nausea vomiting. No cough or shortness of breath.   Hospital Course:  1. hypokalemia  - due to Florinef, poor PO intake  - relative intermittent diarrhea felt to be contributing as well  - replaced on admission and then has been normal, started on a low dose KCL supplementation, K at discharge 3.9  2. H/o Orthostatic hypotension/autonomic neuropathy  - BP fluctuates, and previously needed midodrine and florinef but her BP has been High throughout hospitalization, hence midodrine was stopped and Florinef changed to PRN for SBP<100 or Symptomatic orthostatic hypotension   3. Urinary tract infection: initially suspected but later felt to be colonization  - treated with 3 days of levaquin and then stopped , Urine Cx on 2 occasions with polymicrobial flora - changed Foley 2/28   4.  Multisystem atrophy followed by neurology Dr. Marjory Lies now  - progressive disease without treatment options  - needs rehab now   5. Sacral decubitus ulcers, due to debilitated bed bound status - wound care   Discharge Exam: Filed Vitals:   02/24/13 0530 02/24/13 1400 02/24/13 2138 02/25/13 0616  BP: 147/89 127/77 141/48 168/87  Pulse: 72 82 74 75  Temp: 98.4 F (36.9 C) 98.3 F (36.8 C) 98.1 F (36.7 C) 98 F (36.7 C)  TempSrc: Oral Oral Oral Oral  Resp: 18  20 20   Height:      Weight:      SpO2: 96% 99% 96% 98%    General: AAOx3 Cardiovascular: S1S2/RRR Respiratory: CTAB  Discharge Instructions  Discharge Orders   Future Orders Complete By Expires     Increase activity slowly  As directed         Medication List    STOP taking these medications       midodrine 5 MG tablet  Commonly known as:  PROAMATINE      TAKE these medications       aspirin 81 MG chewable tablet  Chew 81 mg by mouth at bedtime.     carbidopa-levodopa 50-200 MG per tablet  Commonly known as:  SINEMET CR  Take 1 tablet by mouth 3 (three) times daily. 1 tab in AM , 1 tab in the afternoon , and 1 tablet at bedtime     DuoDERM CGF Dressing Misc  Apply 1 each topically every other day as needed.     fludrocortisone 0.1 MG tablet  Commonly known as:  FLORINEF  Take 1  tablet (0.1 mg total) by mouth 2 (two) times daily as needed (if SBP <100 or Symtomatic Orthostatic hypotension).     multivitamin with minerals Tabs  Take 1 tablet by mouth daily.     nutrition supplement Pack  Take 1 packet by mouth 2 (two) times daily between meals.     potassium chloride SA 20 MEQ tablet  Commonly known as:  K-DUR,KLOR-CON  Take 1 tablet (20 mEq total) by mouth daily.     zinc oxide 11.3 % Crea cream  Commonly known as:  BALMEX  Apply 1 application topically daily. Applied to bedsores.          The results of significant diagnostics from this hospitalization (including imaging,  microbiology, ancillary and laboratory) are listed below for reference.    Significant Diagnostic Studies: Dg Chest Portable 1 View  02/21/2013  *RADIOLOGY REPORT*  Clinical Data: Chest pain.  Shortness of breath.  PORTABLE CHEST - 1 VIEW  Comparison: Chest x-ray 10/19/2011.  Findings: Lung volumes are normal.  The linear opacity in the medial aspect of the left base may represent an area of subsegmental atelectasis and/or scarring.  Pulmonary vasculature is within normal limits.  Heart size appears mildly enlarged, but is likely distorted by patient positioning which also slightly distorts the upper mediastinal contours.  Atherosclerosis in the thoracic aorta.  IMPRESSION: 1.  No radiographic evidence of acute cardiopulmonary disease. 2.  Possible mild cardiomegaly, likely accentuated by poor patient positioning. 3.  Atherosclerosis.   Original Report Authenticated By: Trudie Reed, M.D.     Microbiology: Recent Results (from the past 240 hour(s))  URINE CULTURE     Status: None   Collection Time    02/21/13  5:08 PM      Result Value Range Status   Specimen Description URINE, CLEAN CATCH   Final   Special Requests NONE   Final   Culture  Setup Time 02/22/2013 03:27   Final   Colony Count >=100,000 COLONIES/ML   Final   Culture     Final   Value: Multiple bacterial morphotypes present, none predominant. Suggest appropriate recollection if clinically indicated.   Report Status 02/25/2013 FINAL   Final  URINE CULTURE     Status: None   Collection Time    02/22/13  7:30 AM      Result Value Range Status   Specimen Description URINE, CATHETERIZED   Final   Special Requests Normal   Final   Culture  Setup Time 02/22/2013 12:47   Final   Colony Count >=100,000 COLONIES/ML   Final   Culture     Final   Value: Multiple bacterial morphotypes present, none predominant. Suggest appropriate recollection if clinically indicated.   Report Status 02/23/2013 FINAL   Final     Labs: Basic  Metabolic Panel:  Recent Labs Lab 02/21/13 1720 02/21/13 1910 02/22/13 0136 02/22/13 1344 02/23/13 0521 02/24/13 0516 02/25/13 0411  NA 140  --  141  --  143 141 143  K <2.0*  --  2.3* 3.2* 4.9 3.9 3.9  CL 95*  --  96  --  110 105 105  CO2 37*  --  38*  --  30 28 30   GLUCOSE 94  --  104*  --  103* 98 103*  BUN 12  --  10  --  7 19 22   CREATININE 0.38*  --  0.44*  --  0.43* 0.49* 0.52  CALCIUM 8.3*  --  8.2*  --  8.7  8.9 9.1  MG  --  1.8  --   --   --   --   --    Liver Function Tests:  Recent Labs Lab 02/21/13 1720  AST 8  ALT <5  ALKPHOS 42  BILITOT 0.7  PROT 5.6*  ALBUMIN 3.3*   No results found for this basename: LIPASE, AMYLASE,  in the last 168 hours No results found for this basename: AMMONIA,  in the last 168 hours CBC:  Recent Labs Lab 02/21/13 1550  WBC 6.3  NEUTROABS 4.1  HGB 12.9  HCT 37.8  MCV 87.1  PLT 147*   Cardiac Enzymes:  Recent Labs Lab 02/21/13 1720  CKTOTAL 33  TROPONINI <0.30   BNP: BNP (last 3 results) No results found for this basename: PROBNP,  in the last 8760 hours CBG: No results found for this basename: GLUCAP,  in the last 168 hours     Signed:  JOSEPH,PREETHA  [facility] 02/25/2013, 11:57 AM

## 2013-02-25 NOTE — Evaluation (Signed)
Occupational Therapy Evaluation Patient Details Name: [name] MRN: [id number] DOB: 1956/09/19 Today's Date: 02/25/2013 Time: 1191-4782 OT Time Calculation (min): 33 min  OT Assessment / Plan / Recommendation Clinical Impression  Pt admitted with hypokalemia, UTI, and multiple system atrophy.  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  Pt agreeable to OT to assist with I with ADL activity such as self feeding and grooming  to prepare for d/c to SNF.    OT Assessment  Patient needs continued OT Services    Follow Up Recommendations  SNF       Equipment Recommendations  None recommended by OT       Frequency  Min 2X/week    Precautions / Restrictions Precautions Precautions: Fall Precaution Comments: multiple system atrophy - progressive       ADL  Eating/Feeding: Performed;Minimal assistance;Other (comment) (after arms supported on pillows and trunk supported) Where Assessed - Eating/Feeding: Bed level Transfers/Ambulation Related to ADLs: OT session focused on self feeding and positioning, Upon OT entering room pt leaning to the right and not able to get to her tray. Pt total A with positioning, but once pt positioned, tray lowered and food cut into pieces.  Positioning of pt essential to pts success with  self feeding.   ADL Comments: Educated pt on importance of positioning prior to any activity with BUE.  Pt verbalized understanding.  OT explained to pt she would need to ask for help and pt stated she did not want to bother anyone and wanted to do this herself.  Explained she needed help at thist time, but to work with therapists at [facility] . Pt agreed    OT Diagnosis: Generalized weakness  OT Problem List: Decreased strength;Decreased activity tolerance;Decreased coordination OT Treatment Interventions: Self-care/ADL training;Other (comment);Patient/family education (positioning)   OT Goals Acute Rehab OT Goals OT  Goal Formulation: With patient Time For Goal Achievement: 03/11/13 Potential to Achieve Goals: Good ADL Goals Pt Will Perform Eating: with min assist;Supported;Other (comment) (supported sitting with arms supported at pts sides) ADL Goal: Eating - Progress: Goal set today (supported sitting in bed with arms supported on 2 pillows) Pt Will Perform Grooming: with min assist;Supported ADL Goal: Grooming - Progress: Goal set today  Visit Information  Last OT Received On: 02/25/13    Subjective Data  Subjective: I am leaning to the right   Prior Functioning     Home Living Lives With: Family Available Help at Discharge: Skilled Nursing Facility;Other (Comment) (plan is to go to SNF)         Vision/Perception Vision - History Patient Visual Report: No change from baseline   Cognition  Cognition Overall Cognitive Status: Appears within functional limits for tasks assessed/performed Arousal/Alertness: Awake/alert Orientation Level: Appears intact for tasks assessed Behavior During Session: [facility] for tasks performed    Extremity/Trunk Assessment Right Upper Extremity Assessment RUE ROM/Strength/Tone: Deficits RUE ROM/Strength/Tone Deficits: active elbow flexion and extension, shoulder flexion approx 100*, poor grip Left Upper Extremity Assessment LUE ROM/Strength/Tone: Deficits LUE ROM/Strength/Tone Deficits: active elbow flexion and extension, active shoulder flexion approx 100*, poor grip Right Lower Extremity Assessment RLE ROM/Strength/Tone: Deficits RLE ROM/Strength/Tone Deficits: able to perform DF/PF actively, 2+/5 hip flexion, 1/5 hip abduction, assist for heels slides and hip abduction/adduction required, passively able to achieve at least 80* hip and knee flexion Left Lower Extremity Assessment LLE ROM/Strength/Tone: Deficits LLE ROM/Strength/Tone Deficits: able to perform DF/PF actively, 2+/5 hip flexion, 1/5 hip abduction, assist  for heels slides and hip  abduction/adduction required,  passively able to achieve at lease 90* hip and knee flexion     Mobility Bed Mobility Bed Mobility: Not assessed Transfers Transfers: Not assessed           End of Session OT - End of Session Activity Tolerance: Patient tolerated treatment well Patient left: in bed;with call bell/phone within reach  GO     [facility] [place] 02/25/2013, 1:14 PM

## 2013-03-15 ENCOUNTER — Non-Acute Institutional Stay (SKILLED_NURSING_FACILITY): Payer: 59 | Admitting: Internal Medicine

## 2013-03-15 DIAGNOSIS — R531 Weakness: Secondary | ICD-10-CM

## 2013-03-15 DIAGNOSIS — G909 Disorder of the autonomic nervous system, unspecified: Secondary | ICD-10-CM

## 2013-03-15 DIAGNOSIS — N319 Neuromuscular dysfunction of bladder, unspecified: Secondary | ICD-10-CM

## 2013-03-15 DIAGNOSIS — N390 Urinary tract infection, site not specified: Secondary | ICD-10-CM

## 2013-03-15 DIAGNOSIS — R197 Diarrhea, unspecified: Secondary | ICD-10-CM

## 2013-03-15 DIAGNOSIS — R5381 Other malaise: Secondary | ICD-10-CM

## 2013-03-15 DIAGNOSIS — G238 Other specified degenerative diseases of basal ganglia: Secondary | ICD-10-CM

## 2013-03-19 ENCOUNTER — Non-Acute Institutional Stay (SKILLED_NURSING_FACILITY): Payer: 59 | Admitting: Nurse Practitioner

## 2013-03-19 DIAGNOSIS — R338 Other retention of urine: Secondary | ICD-10-CM

## 2013-04-01 ENCOUNTER — Encounter: Payer: Self-pay | Admitting: Nurse Practitioner

## 2013-04-01 NOTE — Progress Notes (Signed)
  Subjective:    Patient ID: Eloisa Northern, female    DOB: [birth year], 57 y.o.   MRN: [id number]  HPI Comments: I was asked to eval pt today for urinary retention. The catheter is in place for comfort due to skin breakdown, she denies hx of neurogenic bladder. The foley was removed today due to pain. She immediately became incontinent of urine, and the pain was relieved.      Review of Systems  Genitourinary: Positive for dysuria, urgency, difficulty urinating and pelvic pain.  All other systems reviewed and are negative.       Objective:   Physical Exam  Constitutional: She is oriented to person, place, and time. No distress.  Eyes: Pupils are equal, round, and reactive to light.  Cardiovascular: Normal rate and regular rhythm.   Pulmonary/Chest: Effort normal and breath sounds normal.  Genitourinary:  + mild suprapubic discomfort when palpated.  Neurological: She is alert and oriented to person, place, and time.  Skin: Skin is warm and dry. She is not diaphoretic.  Psychiatric: She has a normal mood and affect.          Assessment & Plan:  Pt desires at this time to leave foley cathter out. She is incontinent of urine at this time, w/o pain. Will continue to monitor for inability to void.

## 2013-04-04 NOTE — Progress Notes (Signed)
Patient ID: [name], female   DOB: 04-18-1956, 57 y.o.   MRN: [id number]  CC- new admit post hospitalization 02/21/13-02/25/13  PCP: Rogelia Boga, MD  History of present Illness: [name] 57 y/o female patient with past medical history of multisystem atrophy was admitted to hospital with progressive weakness and diarrhea. She had orthostatic hypotension and had uti. She was treated with antibiotics. Her decubitus ulcers were followed by wound care. She has been sent to SNF for STR She was seen in her room today. She complaints of loose stools with more than 6-7 bowel movements a day for past 3 days. Denies any fever or chills. She has been weak lately and has not been participating much with PT/OT. She has been having ocassional dizziness and has low blood pressure readings as well  Past Medical History  Diagnosis Date  . Multiple system atrophy     Followed  [facility] in movement disorder clinic-Dr Rubin Payor  . Osteoporosis   . Arthritis     Knees affected  . Parkinson disease    Past Surgical History  Procedure Laterality Date  . 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 Tonsillectomy    . Refractive surgery    . Neuroendoscopic placement / replacement ventricular catheter w/ attachment shunt / external drain      May 2012   Family History  Problem Relation Age of Onset  . Heart disease Mother   . Heart disease Father   . HIV Father   . Hyperlipidemia Brother    History   Social History  . Marital Status: Single    Spouse Name: N/A    Number of Children: N/A  . Years of Education: N/A   Occupational History  . Not on file.   Social History Main Topics  . Smoking status: Never Smoker   . Smokeless tobacco: Never Used  . Alcohol Use: No  . Drug Use: No  . Sexually Active: Not on file   Other Topics Concern  . Not on file   Social History Narrative   Single   Relocated from [place]   Mother is a Materials engineer   Former Psychologist, forensic   Review of  Systems  Constitutional: Positive for malaise/fatigue. Negative for fever, chills and diaphoresis.  HENT: Negative for congestion.   Eyes: Negative for blurred vision.  Respiratory: Negative for cough and shortness of breath.   Cardiovascular: Negative for chest pain, palpitations and leg swelling.  Gastrointestinal: Positive for diarrhea. Negative for heartburn, nausea, vomiting and abdominal pain.  Genitourinary: Negative for dysuria.  Skin: Negative for rash.  Neurological: Positive for dizziness and weakness. Negative for focal weakness and headaches.  Psychiatric/Behavioral: Negative for memory loss. The patient does not have insomnia.    Physical Exam  Constitutional: She is oriented to person, place, and time. No distress.  Frail, chronically ill apprearing  HENT:  Head: Normocephalic and atraumatic.  Mouth/Throat: Oropharynx is clear and moist.  Eyes: Pupils are equal, round, and reactive to light.  Neck: Normal range of motion. Neck supple.  Cardiovascular: Normal rate and regular rhythm.   Pulmonary/Chest: Effort normal and breath sounds normal. No respiratory distress.  Abdominal: Soft. Bowel sounds are normal. She exhibits no distension. There is no tenderness. There is no rebound and no guarding.  Genitourinary:  Has indwelling foley catheter  Musculoskeletal:  Multiple muscle group atrophy, bed bound and needs assistance with ADLs.   Neurological: She is alert and oriented to person, place,  and time.  Skin: Skin is warm and dry. She is not diaphoretic.  Pressure ulcers present  Psychiatric: She has a normal mood and affect. Her behavior is normal.   Significant Diagnostic Studies: Dg Chest Portable 1 View  02/21/2013  *RADIOLOGY REPORT*  Clinical Data: Chest pain.  Shortness of breath.  PORTABLE CHEST - 1 VIEW  Comparison: Chest x-ray 10/19/2011.  Findings: Lung volumes are normal.  The linear opacity in the medial aspect of the left base may represent an area of  subsegmental atelectasis and/or scarring.  Pulmonary vasculature is within normal limits.  Heart size appears mildly enlarged, but is likely distorted by patient positioning which also slightly distorts the upper mediastinal contours.  Atherosclerosis in the thoracic aorta.  IMPRESSION: 1.  No radiographic evidence of acute cardiopulmonary disease. 2.  Possible mild cardiomegaly, likely accentuated by poor patient positioning. 3.  Atherosclerosis.   Original Report Authenticated By: Trudie Reed, M.D.     Microbiology: Recent Results (from the past 240 hour(s))   URINE CULTURE     Status: None     Collection Time      02/21/13  5:08 PM       Result  Value  Range  Status     Specimen Description  URINE, CLEAN CATCH     Final     Special Requests  NONE     Final     Culture  Setup Time  02/22/2013 03:27     Final     Colony Count  >=100,000 COLONIES/ML     Final     Culture        Final     Value:  Multiple bacterial morphotypes present, none predominant. Suggest appropriate recollection if clinically indicated.     Report Status  02/25/2013 FINAL     Final   URINE CULTURE     Status: None     Collection Time      02/22/13  7:30 AM       Result  Value  Range  Status     Specimen Description  URINE, CATHETERIZED     Final     Special Requests  Normal     Final     Culture  Setup Time  02/22/2013 12:47     Final     Colony Count  >=100,000 COLONIES/ML     Final     Culture        Final     Value:  Multiple bacterial morphotypes present, none predominant. Suggest appropriate recollection if clinically indicated.     Report Status  02/23/2013 FINAL     Final      Labs: Basic Metabolic Panel:   Recent Labs Lab  02/21/13 1720  02/21/13 1910  02/22/13 0136  02/22/13 1344  02/23/13 0521  02/24/13 0516  02/25/13 0411   NA  140   --   141   --   143  141  143   K  <2.0*   --   2.3*  3.2*  4.9  3.9  3.9   CL  95*   --   96   --   110  105  105   CO2  37*   --   38*   --   30  28  30     GLUCOSE  94   --   104*   --   103*  98  103*   BUN  12   --  10   --   7  19  22    CREATININE  0.38*   --   0.44*   --   0.43*  0.49*  0.52   CALCIUM  8.3*   --   8.2*   --   8.7  8.9  9.1   MG   --   1.8   --    --    --    --    --     Liver Function Tests:   Recent Labs Lab  02/21/13 1720   AST  8   ALT  <5   ALKPHOS  42   BILITOT  0.7   PROT  5.6*   ALBUMIN  3.3*    CBC:   Recent Labs Lab  02/21/13 1550   WBC  6.3   NEUTROABS  4.1   HGB  12.9   HCT  37.8   MCV  87.1   PLT  147*    Cardiac Enzymes:   Recent Labs Lab  02/21/13 1720   CKTOTAL  33   TROPONINI  <0.30    ASSESSMENT/PLAN-  Generalized weakness- in setting of chronic debility from multisystem atrophy and recent infection. Will have her work with therapy. Fall precautions and pressure ulcer preventions. Encourage po intake. Goal is for her to return home with home care  Diarrhea- frequent loose stools for 3 days now. Has recently been on antibiotics. Moist mucus membrane. No nausea or vomiting. Normal abdominal exam. Will send stool c.diff pcr and check bmp for lytes and cbc to rule out infection  Multiple System Atrophy- chronic issue. to follow with Dr Marjory Lies. Continue carbidopa- levodopa tid  Hypotension- likely from autonomic neuropathy. Her midodrine had been held in hospital. On florinef  Urinary tract infection vs colonization- completed antibiotic course, has indweling foley catheter. Monitor for now  Neurogenic bladder with indwelling foley catheter- foley care to be continued  Sacral decubitus ulcers- pressure ulcer prophylaxis, has gel overlay mattress, zinc oxide to improve healing and wound care to be continued. Will continue nutritional supplements, and MVI for now  Autonomic neuropathy- currently on florinef 0.1 mg bid. Will continue bp monitor q shift and if remains hypotensive preventing her from participating with therapy, will reintroduce midodrine.  Hypokalemia- will  continue kcl supplement, check bmp   LABS ORDERED- cbc, cmp, c.diff pcr, bp check q shift

## 2013-05-23 ENCOUNTER — Telehealth: Payer: Self-pay | Admitting: Neurology

## 2013-05-28 ENCOUNTER — Encounter: Payer: Self-pay | Admitting: Nurse Practitioner

## 2013-06-06 ENCOUNTER — Encounter: Payer: Self-pay | Admitting: Nurse Practitioner

## 2013-06-14 ENCOUNTER — Encounter: Payer: Self-pay | Admitting: *Deleted

## 2013-06-17 ENCOUNTER — Ambulatory Visit: Payer: 59 | Admitting: Internal Medicine

## 2013-06-17 DIAGNOSIS — Z0289 Encounter for other administrative examinations: Secondary | ICD-10-CM

## 2013-06-17 IMAGING — CR DG CHEST 1V PORT
1 series · 1 of 1 positions shown · non-contrast
Comparison: Chest x-ray 10/19/2011.

CLINICAL DATA: Chest pain.  Shortness of breath.

PORTABLE CHEST - 1 VIEW

[AP]
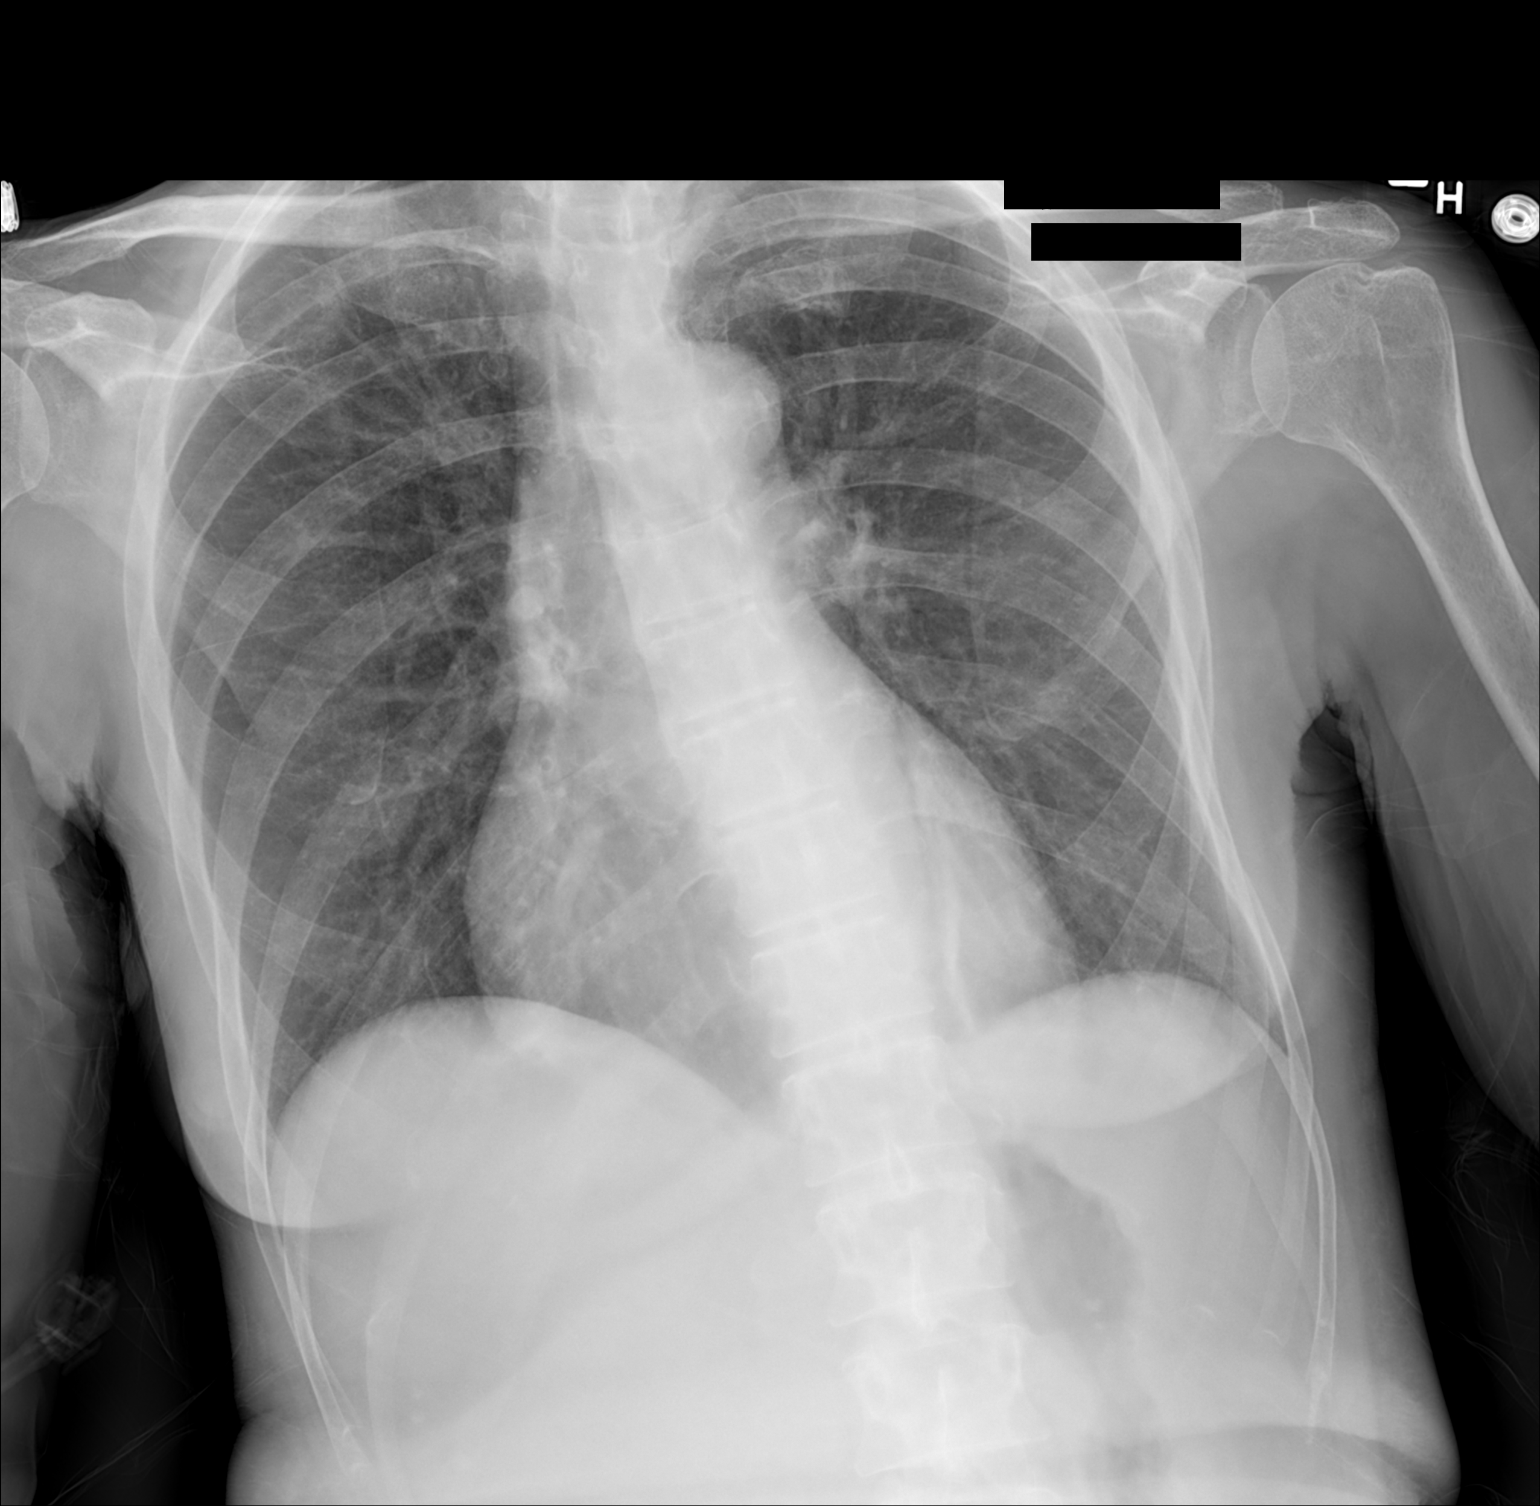

[1 of 1 positions shown; findings below may reference images not displayed]

FINDINGS: Lung volumes are normal.  The linear opacity in the
medial aspect of the left base may represent an area of
subsegmental atelectasis and/or scarring.  Pulmonary vasculature is
within normal limits.  Heart size appears mildly enlarged, but is
likely distorted by patient positioning which also slightly
distorts the upper mediastinal contours.  Atherosclerosis in the
thoracic aorta.
IMPRESSION: 1.  No radiographic evidence of acute cardiopulmonary disease.
2.  Possible mild cardiomegaly, likely accentuated by poor patient
positioning.
3.  Atherosclerosis.

## 2013-06-18 ENCOUNTER — Encounter: Payer: Self-pay | Admitting: Nurse Practitioner

## 2013-06-20 ENCOUNTER — Encounter: Payer: Self-pay | Admitting: Nurse Practitioner

## 2013-06-21 ENCOUNTER — Encounter: Payer: Self-pay | Admitting: Nurse Practitioner

## 2013-06-21 DIAGNOSIS — N319 Neuromuscular dysfunction of bladder, unspecified: Secondary | ICD-10-CM

## 2013-06-21 DIAGNOSIS — M1990 Unspecified osteoarthritis, unspecified site: Secondary | ICD-10-CM

## 2013-06-21 DIAGNOSIS — G238 Other specified degenerative diseases of basal ganglia: Secondary | ICD-10-CM

## 2013-06-21 DIAGNOSIS — G20 Parkinson's disease: Secondary | ICD-10-CM

## 2013-06-21 DIAGNOSIS — G20A1 Parkinson's disease without dyskinesia, without mention of fluctuations: Secondary | ICD-10-CM

## 2013-06-24 ENCOUNTER — Non-Acute Institutional Stay: Payer: BC Managed Care – PPO | Admitting: Internal Medicine

## 2013-06-24 ENCOUNTER — Encounter: Payer: Self-pay | Admitting: Nurse Practitioner

## 2013-06-24 DIAGNOSIS — G20A1 Parkinson's disease without dyskinesia, without mention of fluctuations: Secondary | ICD-10-CM

## 2013-06-24 DIAGNOSIS — G20 Parkinson's disease: Secondary | ICD-10-CM

## 2013-06-24 DIAGNOSIS — N319 Neuromuscular dysfunction of bladder, unspecified: Secondary | ICD-10-CM

## 2013-06-24 DIAGNOSIS — M1990 Unspecified osteoarthritis, unspecified site: Secondary | ICD-10-CM

## 2013-06-24 HISTORY — DX: Unspecified osteoarthritis, unspecified site: M19.90

## 2013-06-24 HISTORY — DX: Neuromuscular dysfunction of bladder, unspecified: N31.9

## 2013-06-24 HISTORY — DX: Parkinson's disease without dyskinesia, without mention of fluctuations: G20.A1

## 2013-06-24 HISTORY — DX: Parkinson's disease: G20

## 2013-06-24 NOTE — Progress Notes (Signed)
Patient ID: [name], female   DOB: Mar 27, 1956, 57 y.o.   MRN: [id number]  CC- medical clearance  Allergies  Allergen Reactions  . Ibuprofen     Facial swelling  . Sulfa Antibiotics     Unknown - happened as a baby   HPI- [name] 57 y/o female patient with past medical history of multisystem atrophy, osteoporosis, neurogenic bladder, recurrent uti and hypotension  among others is here for long term care. She is being seen today for medical clearance for planned cystoscopy and suprapubic catheter placement due to her neurogenic bladder and recurrent uti. She was seen in her room today.   Review of Systems  Constitutional: Negative for fever and chills.  HENT: Negative for congestion.   Eyes: Negative for blurred vision.  Respiratory: Negative for cough, shortness of breath and wheezing.   Cardiovascular: Negative for chest pain and palpitations.  Gastrointestinal: Negative for heartburn, nausea, vomiting, abdominal pain and diarrhea.  Musculoskeletal: Negative for falls.  Skin: Negative for itching and rash.  Neurological: Negative for dizziness, focal weakness, seizures and headaches.  Endo/Heme/Allergies: Does not bruise/bleed easily.  Psychiatric/Behavioral: Negative for depression. The patient does not have insomnia.     Past Medical History  Diagnosis Date  . Multiple system atrophy     Followed  [facility] in movement disorder clinic-Dr Rubin Payor  . Osteoporosis   . Arthritis     Knees affected  . Parkinson disease   . Osteoarthritis 06/24/2013  . Parkinson's disease 06/24/2013  . Neurogenic bladder 06/24/2013   Past Surgical History  Procedure Laterality Date  . 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 Tonsillectomy    . Refractive surgery    . Neuroendoscopic placement / replacement ventricular catheter w/ attachment shunt / external drain      May 2012   Pulse 78  Temp(Src) 97.1 F (36.2 C)  Resp 16  SpO2 96%  Constitutional: She is oriented to person,  place, and time. No distress.  Frail, chronically ill apprearing  HENT:  Head: Normocephalic and atraumatic.   Mouth/Throat: Oropharynx is clear and moist.  Eyes: Pupils are equal, round, and reactive to light.  Neck: Normal range of motion. Neck supple.  Cardiovascular: Normal rate and regular rhythm.   Pulmonary/Chest: Effort normal and breath sounds normal. No respiratory distress.  Abdominal: Soft. Bowel sounds are normal. She exhibits no distension. There is no tenderness. There is no rebound and no guarding.  Genitourinary:  incontinent Musculoskeletal:  Multiple muscle group atrophy, bed bound and needs assistance with ADLs.   Neurological: She is alert and oriented to person, place, and time.  Skin: Skin is warm and dry. She is not diaphoretic.  Pressure ulcers present in her sacral area Psychiatric: She has a normal mood and affect. Her behavior is normal.   LABS- 06/21/13 na 143, k 3.6, cl 104, co2 31, glu 81, bun 20, cr 0.6, ca 9.1, t.pro 5.7, alb 3.8, t.bil 0.8, alp 47, ast 16, alt 11, t.chol 188, hdl 54,ldl 121, vldl 12.6, tg 63, tsh 2.16, wbc 6.5, hb 13.2, hct 40.7, mcv 92.3, plt 165  ASSESSMENT/PLAN-  Neurogenic bladder- in setting of her multi system atrophy disorder. Is awaiting cystoscopy and suprapubic catheter placement pending medical clearance. Supportive care for incontinence with skin care for now. No symptoms of uti at present  Medical clearance- she has no known cardiac history, diabetes, vascular disease or renal insufficiency. She has ventricular shunt placed for drainage of cerebral cyst in  the past. patient is undergoing low risk procedure. Will get her EKG to assess for any new changes/ arrythmia or conduction abnormality.

## 2013-06-26 ENCOUNTER — Ambulatory Visit: Payer: Self-pay | Admitting: Neurology

## 2013-06-28 ENCOUNTER — Ambulatory Visit: Payer: Self-pay

## 2013-06-28 LAB — BASIC METABOLIC PANEL
Anion Gap: 6 — ABNORMAL LOW (ref 7–16)
Calcium, Total: 8.8 mg/dL (ref 8.5–10.1)
Creatinine: 0.69 mg/dL (ref 0.60–1.30)
Glucose: 139 mg/dL — ABNORMAL HIGH (ref 65–99)
Sodium: 145 mmol/L (ref 136–145)

## 2013-07-01 ENCOUNTER — Non-Acute Institutional Stay: Payer: BC Managed Care – PPO | Admitting: Internal Medicine

## 2013-07-01 ENCOUNTER — Encounter: Payer: Self-pay | Admitting: Internal Medicine

## 2013-07-01 DIAGNOSIS — I951 Orthostatic hypotension: Secondary | ICD-10-CM | POA: Insufficient documentation

## 2013-07-01 DIAGNOSIS — N319 Neuromuscular dysfunction of bladder, unspecified: Secondary | ICD-10-CM | POA: Insufficient documentation

## 2013-07-01 DIAGNOSIS — R55 Syncope and collapse: Secondary | ICD-10-CM

## 2013-07-01 NOTE — Progress Notes (Signed)
Patient ID: [name], female   DOB: Jan 12, 1956, 57 y.o.   MRN: [id number]  CC- low blood pressure, blackout  HPI- 57 y/o female patient with past medical history of multisystem atrophy, parkinson's disease,  osteoporosis, neurogenic bladder, recurrent uti and hypotension among others is here for long term care. She had a syncopal episode on Friday while sitting on her shower chair. Her BP was unrecordable at that time. She was made to lie down ad following this had bp  Of 98/60. She regained her consciousness in a minute. She was having trouble with her vision (she was unable to see anything as per patient for 15 minutes). No seizure like activity noted. No new focal weakness noted by staff. docotor on call was contacted and her florinef was increased from 0.1 mg bid to 0.1 mg tid. She was back to her baseline She was seen today in her room. She mentions her strength to be fine. No further syncopal episode. She mentions this to be her first episode. No chest pain or SOB or palpitations associated with this. Patient denies any complaints at present. Reviewed her EKG As per her brother, she used to be on midodrine prior to last hospitalization- unclear about dosing at this point.  Review of Systems  Constitutional: Negative for fever and chills.  HENT: Negative for congestion.   Eyes: Negative for blurred vision.  Respiratory: Negative for cough, shortness of breath and wheezing.   Cardiovascular: Negative for chest pain and palpitations.  Gastrointestinal: Negative for heartburn, nausea, vomiting, abdominal pain and diarrhea.  Musculoskeletal: Negative for falls.  Skin: Negative for itching and rash.  Neurological: Negative for dizziness, focal weakness, seizures and headaches.  Endo/Heme/Allergies: Does not bruise/bleed easily.  Psychiatric/Behavioral: Negative for depression. The patient does not have insomnia.    BP 97/57  Pulse 62  Temp(Src) 97.2 F (36.2 C)  Resp 16  SpO2  97%  orthostatics checked- Lying down- bp 93/64, HR 82/min Sitting up- bp 81/54, HR 83/min  Physical exam- Constitutional: She is oriented to person, place, and time. No distress.  Frail, chronically ill apprearing  HENT:  Head: Normocephalic and atraumatic.   Mouth/Throat: Oropharynx is clear and moist.   Eyes: Pupils are equal, round, and reactive to light.   Neck: Normal range of motion. Neck supple.   Cardiovascular: Normal rate and regular rhythm.    Pulmonary/Chest: Effort normal and breath sounds normal. No respiratory distress.   Abdominal: Soft. Bowel sounds are normal. She exhibits no distension. There is no tenderness. There is no rebound and no guarding.  Genitourinary:  Incontinent. No bladder distension or tenderness Musculoskeletal:  Multiple muscle group atrophy, bed bound and needs assistance with ADLs.   Neurological: She is alert and oriented to person, place, and time.   Skin: Skin is warm and dry. She is not diaphoretic.  Pressure ulcers present in her sacral area Psychiatric: She has a normal mood and affect. Her behavior is normal.   LABS- 06/21/13 na 143, k 3.6, cl 104, co2 31, glu 81, bun 20, cr 0.6, ca 9.1, t.pro 5.7, alb 3.8, t.bil 0.8, alp 47, ast 16, alt 11, t.chol 188, hdl 54,ldl 121, vldl 12.6, tg 63, tsh 2.16, wbc 6.5, hb 13.2, hct 40.7, mcv 92.3, plt 165  Constitutional: She is oriented to person, place, and time. No distress.  Frail, chronically ill apprearing  HENT:  Head: Normocephalic and atraumatic.   Mouth/Throat: Oropharynx is clear and moist.   Eyes: Pupils are equal, round, and reactive  to light.   Neck: Normal range of motion. Neck supple.   Cardiovascular: Normal rate and regular rhythm.    Pulmonary/Chest: Effort normal and breath sounds normal. No respiratory distress.   Abdominal: Soft. Bowel sounds are normal. She exhibits no distension. There is no tenderness. There is no rebound and no guarding.  Genitourinary:   incontinent Musculoskeletal:  Multiple muscle group atrophy, bed bound and needs assistance with ADLs.   Neurological: She is alert and oriented to person, place, and time.   Skin: Skin is warm and dry. She is not diaphoretic.  Pressure ulcers present in her sacral area Psychiatric: She has a normal mood and affect. Her behavior is normal.   LABS- 06/21/13 na 143, k 3.6, cl 104, co2 31, glu 81, bun 20, cr 0.6, ca 9.1, t.pro 5.7, alb 3.8, t.bil 0.8, alp 47, ast 16, alt 11, t.chol 188, hdl 54,ldl 121, vldl 12.6, tg 63, tsh 2.16, wbc 6.5, hb 13.2, hct 40.7, mcv 92.3, plt 165  06/25/13 EKG- normal sinus rhythm, normal PR interval, LAD, flattened T waves in v4-6, when compared to old ekg from 3/14 artifacts present in both. No acute ST-T wave  changes  ASSESSMENT/PLAN-  Orthostatic hypotension- persists and fluctuates. Reviewed orthostatics Will continue florinef at 0.1 mg tid for now and add midodrine 5 mg q12h for now. Check bp q shift for now  Syncope- s/p syncopal episode 3 days back. Likely to be vasovagal in setting of her chronic hypotension and vasovagal episode while sitting. Given this to be her first episode of syncope and her pending a medical clearance, i would like to get cardiology referral to rule out heart block vs arrythmia. She will benefit from carotid doppler to rule out stenosis. Recent thyroid function test is normal. Also given her history of shunt placement and new loss of vision which is resolved now, will have an appointment scheduled with her neurologist. On baby aspirin for now  Neurogenic bladder- has urinary incontinence from this. Pending suprapubic catheter placement. Continue pressur eulcer prophylaxis and skin care with pull ups/ briefs for now. No symptoms of uti for now. Monitor clinically

## 2013-07-10 ENCOUNTER — Encounter: Payer: Self-pay | Admitting: Internal Medicine

## 2013-07-11 ENCOUNTER — Other Ambulatory Visit: Payer: Self-pay | Admitting: Urology

## 2013-07-16 ENCOUNTER — Encounter: Payer: Self-pay | Admitting: Adult Health

## 2013-07-16 ENCOUNTER — Non-Acute Institutional Stay: Payer: BC Managed Care – PPO | Admitting: Adult Health

## 2013-07-16 ENCOUNTER — Other Ambulatory Visit (HOSPITAL_COMMUNITY): Payer: Self-pay | Admitting: *Deleted

## 2013-07-16 DIAGNOSIS — G20 Parkinson's disease: Secondary | ICD-10-CM

## 2013-07-16 DIAGNOSIS — I951 Orthostatic hypotension: Secondary | ICD-10-CM

## 2013-07-16 DIAGNOSIS — N319 Neuromuscular dysfunction of bladder, unspecified: Secondary | ICD-10-CM

## 2013-07-16 DIAGNOSIS — E876 Hypokalemia: Secondary | ICD-10-CM | POA: Insufficient documentation

## 2013-07-16 NOTE — Assessment & Plan Note (Addendum)
She has a chronic foley is without change in status is due for an s/p foley placement; will monitor

## 2013-07-16 NOTE — Progress Notes (Signed)
Patient ID: [name], female   DOB: 17-Jan-1956, 57 y.o.   MRN: [id number]  [facility]  Allergies  Allergen Reactions  . Ibuprofen     Facial swelling  . Sulfa Antibiotics     Unknown - happened as a Advertising account executive Complaint  Patient presents with  . Medical Managment of Chronic Issues    HPI: She is being seen for the management of her chronic illnesses. Her blood pressure has been liable with very high peaks and running very low as well. She has required frequent medication adjustments with midodrine and florinef. At this time she is not voicing any concerns present.   Past Medical History  Diagnosis Date  . Multiple system atrophy     Followed  [facility] in movement disorder clinic-Dr Rubin Payor  . Osteoporosis   . Arthritis     Knees affected  . Parkinson disease   . Osteoarthritis 06/24/2013  . Parkinson's disease 06/24/2013  . Neurogenic bladder 06/24/2013    Past Surgical History  Procedure Laterality Date  . 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 Tonsillectomy    . Refractive surgery    . Neuroendoscopic placement / replacement ventricular catheter w/ attachment shunt / external drain      May 2012    VITAL SIGNS BP 153/72  Pulse 61  Ht 5\' 2"  (1.575 m)  Wt 85 lb 4.8 oz (38.692 kg)  BMI 15.6 kg/m2   Patient's Medications  New Prescriptions   No medications on file  Previous Medications   ASPIRIN 81 MG CHEWABLE TABLET    Chew 81 mg by mouth at bedtime.    CARBIDOPA-LEVODOPA (SINEMET CR) 50-200 MG PER TABLET    Take 1 tablet by mouth 3 (three) times daily. 1 tab in AM , 1 tab in the afternoon , and 1 tablet at bedtime   CARBIDOPA-LEVODOPA (SINEMET IR) 25-100 MG PER TABLET    Take 1 tablet by mouth daily as needed.   FEEDING SUPPLEMENT (PRO-STAT SUGAR FREE 64) LIQD    Take 30 mLs by mouth daily.   FLUDROCORTISONE (FLORINEF) 0.1 MG TABLET    Take 0.1 mg by mouth 2 (two) times daily.    LOPERAMIDE (IMODIUM) 2 MG CAPSULE    Take 2 mg by mouth 4 (four) times  daily as needed for diarrhea or loose stools.   MIDODRINE (PROAMATINE) 2.5 MG TABLET    Take 2.5 mg by mouth 2 (two) times daily.   MULTIPLE VITAMIN (MULTIVITAMIN WITH MINERALS) TABS    Take 1 tablet by mouth daily.   POTASSIUM CHLORIDE SA (K-DUR,KLOR-CON) 20 MEQ TABLET    Take 1 tablet (20 mEq total) by mouth daily.   VITAMIN C (ASCORBIC ACID) 500 MG TABLET    Take 500 mg by mouth 2 (two) times daily.   ZINC OXIDE (BALMEX) 11.3 % CREA CREAM    Apply 1 application topically daily. Applied to bedsores.   ZINC SULFATE 220 MG CAPSULE    Take 220 mg by mouth daily.  Modified Medications   No medications on file  Discontinued Medications   No medications on file    SIGNIFICANT DIAGNOSTIC EXAMS   07-01-13: carotid ultrasound: carotid artery bulb regions cannot be obtained secondary to the patients condition/body habitus. Otherwise no hemodynamically significant carotid artery stenosis suggested.   LABS REVIEWED:   06-02-13; urine culture enterococcus faecalis: ampicillin 06-21-13: glucose 81; bun 20; creat 0.6; k+ 3.6; na++ 143; liver normal albumin 3.8  Chol 188; ldl 121; trig 63 06-28-13: glucose 139; bun 20; creat 0.69; k+ 3.6;na++ 145   Review of Systems  Constitutional: Negative for malaise/fatigue.  Respiratory: Negative for cough and shortness of breath.   Cardiovascular: Negative for chest pain and palpitations.  Gastrointestinal: Negative for heartburn and abdominal pain.  Musculoskeletal: Negative for myalgias.  Skin: Negative.   Neurological: Negative for dizziness and headaches.  Psychiatric/Behavioral: Negative for depression. The patient does not have insomnia.    Physical Exam  Constitutional:  frail  Neck: Neck supple. No JVD present. No thyromegaly present.  Cardiovascular: Normal rate, regular rhythm and intact distal pulses.   Respiratory: Effort normal and breath sounds normal. No respiratory distress.  GI: Soft. Bowel sounds are normal. She exhibits no distension.  There is no tenderness.  Genitourinary:  Has foley  Musculoskeletal: She exhibits no edema.  Is out of bed to wheelchair; has stiffness present to extremities   Neurological: She is alert.  Skin: Skin is warm and dry.      ASSESSMENT/ PLAN: Neurogenic bladder She has a chronic foley is without change in status is due for an s/p foley placement; will monitor  Parkinson's disease Without change in status will continue sinemet 50/200 mg three times daily and 25/100 mg daily as needed and will continue to monitor   Orthostatic hypotension Her blood pressure readings are highly variable at this time will continue midodrine at 2.5 mg twice daily will lower florinef to 0.1 mg twice daily will continue support hose daily;  Will have nursing check blood pressure every 4hours while awake and at least during the night shift; will have nursing check blood pressure after getting patient out of bed to wheelchair and will continue to monitor her status   Hypokalemia Will continue k+ 20 meq daily     Time spent with patient 50 minutes.

## 2013-07-16 NOTE — Assessment & Plan Note (Signed)
Will continue k+ 20 meq daily  

## 2013-07-16 NOTE — Assessment & Plan Note (Signed)
Her blood pressure readings are highly variable at this time will continue midodrine at 2.5 mg twice daily will lower florinef to 0.1 mg twice daily will continue support hose daily;  Will have nursing check blood pressure every 4hours while awake and at least during the night shift; will have nursing check blood pressure after getting patient out of bed to wheelchair and will continue to monitor her status

## 2013-07-16 NOTE — Assessment & Plan Note (Signed)
Without change in status will continue sinemet 50/200 mg three times daily and 25/100 mg daily as needed and will continue to monitor

## 2013-07-17 ENCOUNTER — Encounter (HOSPITAL_COMMUNITY): Payer: Self-pay | Admitting: *Deleted

## 2013-07-17 ENCOUNTER — Encounter (HOSPITAL_COMMUNITY): Payer: Self-pay | Admitting: Pharmacy Technician

## 2013-07-25 MED ORDER — GENTAMICIN SULFATE 40 MG/ML IJ SOLN
193.0000 mg | INTRAVENOUS | Status: DC
  Filled 2013-07-25: qty 4.83

## 2013-07-26 ENCOUNTER — Encounter (HOSPITAL_COMMUNITY): Payer: Self-pay | Admitting: *Deleted

## 2013-07-26 ENCOUNTER — Encounter (HOSPITAL_COMMUNITY)
Admission: RE | Disposition: A | Discharge status: Nursing Home (Includes ICF) | Payer: Self-pay | Source: Ambulatory Visit | Attending: Urology

## 2013-07-26 ENCOUNTER — Ambulatory Visit (HOSPITAL_COMMUNITY): Payer: BC Managed Care – PPO | Admitting: Anesthesiology

## 2013-07-26 ENCOUNTER — Encounter (HOSPITAL_COMMUNITY): Payer: Self-pay | Admitting: Anesthesiology

## 2013-07-26 ENCOUNTER — Ambulatory Visit (HOSPITAL_COMMUNITY)
Admission: RE | Admit: 2013-07-26 | Discharge: 2013-07-26 | Disposition: A | Discharge status: Other Acute Inpatient Hospital | Payer: BC Managed Care – PPO | Source: Ambulatory Visit | Attending: Urology | Admitting: Urology

## 2013-07-26 DIAGNOSIS — N319 Neuromuscular dysfunction of bladder, unspecified: Secondary | ICD-10-CM | POA: Insufficient documentation

## 2013-07-26 DIAGNOSIS — G238 Other specified degenerative diseases of basal ganglia: Secondary | ICD-10-CM | POA: Diagnosis not present

## 2013-07-26 DIAGNOSIS — M1990 Unspecified osteoarthritis, unspecified site: Secondary | ICD-10-CM | POA: Diagnosis not present

## 2013-07-26 DIAGNOSIS — Z882 Allergy status to sulfonamides status: Secondary | ICD-10-CM | POA: Insufficient documentation

## 2013-07-26 DIAGNOSIS — Z886 Allergy status to analgesic agent status: Secondary | ICD-10-CM | POA: Insufficient documentation

## 2013-07-26 DIAGNOSIS — N210 Calculus in bladder: Secondary | ICD-10-CM | POA: Diagnosis not present

## 2013-07-26 DIAGNOSIS — L8990 Pressure ulcer of unspecified site, unspecified stage: Secondary | ICD-10-CM | POA: Insufficient documentation

## 2013-07-26 DIAGNOSIS — Z982 Presence of cerebrospinal fluid drainage device: Secondary | ICD-10-CM | POA: Diagnosis not present

## 2013-07-26 DIAGNOSIS — M1710 Unilateral primary osteoarthritis, unspecified knee: Secondary | ICD-10-CM | POA: Insufficient documentation

## 2013-07-26 DIAGNOSIS — R32 Unspecified urinary incontinence: Secondary | ICD-10-CM | POA: Insufficient documentation

## 2013-07-26 DIAGNOSIS — M810 Age-related osteoporosis without current pathological fracture: Secondary | ICD-10-CM | POA: Insufficient documentation

## 2013-07-26 DIAGNOSIS — N3020 Other chronic cystitis without hematuria: Secondary | ICD-10-CM | POA: Insufficient documentation

## 2013-07-26 DIAGNOSIS — L89109 Pressure ulcer of unspecified part of back, unspecified stage: Secondary | ICD-10-CM | POA: Diagnosis not present

## 2013-07-26 DIAGNOSIS — G909 Disorder of the autonomic nervous system, unspecified: Secondary | ICD-10-CM | POA: Diagnosis not present

## 2013-07-26 HISTORY — DX: Disorder of the autonomic nervous system, unspecified: G90.9

## 2013-07-26 HISTORY — DX: Shortness of breath: R06.02

## 2013-07-26 HISTORY — DX: Retention of urine, unspecified: R33.9

## 2013-07-26 HISTORY — PX: INSERTION OF SUPRAPUBIC CATHETER: SHX5870

## 2013-07-26 HISTORY — DX: Orthostatic hypotension: I95.1

## 2013-07-26 HISTORY — DX: Bed confinement status: Z74.01

## 2013-07-26 HISTORY — PX: CYSTOSCOPY WITH LITHOLAPAXY: SHX1425

## 2013-07-26 HISTORY — DX: Personal history of other endocrine, nutritional and metabolic disease: Z86.39

## 2013-07-26 HISTORY — DX: Unspecified urinary incontinence: R32

## 2013-07-26 HISTORY — DX: Pressure ulcer of unspecified site, unspecified stage: L89.90

## 2013-07-26 HISTORY — DX: Dysuria: R30.0

## 2013-07-26 LAB — CBC
HCT: 38.7 % (ref 36.0–46.0)
MCH: 30.4 pg (ref 26.0–34.0)
MCV: 89.8 fL (ref 78.0–100.0)
RDW: 12.4 % (ref 11.5–15.5)
WBC: 6.4 10*3/uL (ref 4.0–10.5)

## 2013-07-26 LAB — BASIC METABOLIC PANEL
BUN: 15 mg/dL (ref 6–23)
CO2: 31 mEq/L (ref 19–32)
Calcium: 9.4 mg/dL (ref 8.4–10.5)
Chloride: 103 mEq/L (ref 96–112)
Creatinine, Ser: 0.45 mg/dL — ABNORMAL LOW (ref 0.50–1.10)
Glucose, Bld: 102 mg/dL — ABNORMAL HIGH (ref 70–99)

## 2013-07-26 SURGERY — INSERTION, SUPRAPUBIC CATHETER
Anesthesia: Monitor Anesthesia Care | Site: Bladder

## 2013-07-26 MED ORDER — HYDRALAZINE HCL 20 MG/ML IJ SOLN
INTRAMUSCULAR | Status: AC
  Filled 2013-07-26: qty 1

## 2013-07-26 MED ORDER — CEFAZOLIN SODIUM-DEXTROSE 2-3 GM-% IV SOLR: 2.0000 g | Freq: Once | INTRAVENOUS | Status: DC

## 2013-07-26 MED ORDER — FENTANYL CITRATE 0.05 MG/ML IJ SOLN
INTRAMUSCULAR | Status: AC
  Filled 2013-07-26: qty 2

## 2013-07-26 MED ORDER — STERILE WATER FOR IRRIGATION IR SOLN
Status: DC | PRN
  Administered 2013-07-26: 6000 mL via INTRAVESICAL

## 2013-07-26 MED ORDER — LACTATED RINGERS IV SOLN
INTRAVENOUS | Status: DC
  Administered 2013-07-26: 1000 mL via INTRAVENOUS

## 2013-07-26 MED ORDER — CEFAZOLIN SODIUM-DEXTROSE 2-3 GM-% IV SOLR
INTRAVENOUS | Status: AC
  Filled 2013-07-26: qty 50

## 2013-07-26 MED ORDER — HYDRALAZINE HCL 20 MG/ML IJ SOLN
2.5000 mg | INTRAMUSCULAR | Status: DC | PRN
  Administered 2013-07-26: 2.5 mg via INTRAVENOUS

## 2013-07-26 MED ORDER — TRAMADOL HCL 50 MG PO TABS: 50.0000 mg | ORAL_TABLET | Freq: Four times a day (QID) | ORAL | Status: DC | PRN

## 2013-07-26 MED ORDER — PROMETHAZINE HCL 25 MG/ML IJ SOLN: 6.2500 mg | INTRAMUSCULAR | Status: DC | PRN

## 2013-07-26 MED ORDER — MEPERIDINE HCL 50 MG/ML IJ SOLN: 6.2500 mg | INTRAMUSCULAR | Status: DC | PRN

## 2013-07-26 MED ORDER — FENTANYL CITRATE 0.05 MG/ML IJ SOLN
25.0000 ug | INTRAMUSCULAR | Status: DC | PRN
  Administered 2013-07-26 (×3): 25 ug via INTRAVENOUS

## 2013-07-26 SURGICAL SUPPLY — 32 items
BAG URINE DRAINAGE (UROLOGICAL SUPPLIES) ×3 IMPLANT
BAG URO CATCHER STRL LF (DRAPE) ×3 IMPLANT
BLADE SURG ROTATE 9660 (MISCELLANEOUS) IMPLANT
CANISTER SUCT LVC 12 LTR MEDI- (MISCELLANEOUS) ×3 IMPLANT
CATH BONANNO SUPRAPUBIC 14G (CATHETERS) IMPLANT
CATH FOLEY 2WAY SLVR  5CC 16FR (CATHETERS)
CATH FOLEY 2WAY SLVR  5CC 18FR (CATHETERS)
CATH FOLEY 2WAY SLVR 5CC 16FR (CATHETERS) IMPLANT
CATH FOLEY 2WAY SLVR 5CC 18FR (CATHETERS) IMPLANT
CATH SILICONE 16FRX5CC (CATHETERS) ×3 IMPLANT
CLOTH BEACON ORANGE TIMEOUT ST (SAFETY) ×3 IMPLANT
DRAPE CAMERA CLOSED 9X96 (DRAPES) ×3 IMPLANT
ELECT REM PT RETURN 9FT ADLT (ELECTROSURGICAL) ×3
ELECTRODE REM PT RTRN 9FT ADLT (ELECTROSURGICAL) ×2 IMPLANT
GLOVE BIO SURGEON STRL SZ8 (GLOVE) ×15 IMPLANT
GLOVE BIOGEL M STRL SZ7.5 (GLOVE) IMPLANT
GOWN STRL NON-REIN LRG LVL3 (GOWN DISPOSABLE) IMPLANT
GOWN STRL REIN XL XLG (GOWN DISPOSABLE) ×3 IMPLANT
HOLDER FOLEY CATH W/STRAP (MISCELLANEOUS) IMPLANT
IV NS IRRIG 3000ML ARTHROMATIC (IV SOLUTION) IMPLANT
KIT SUPRAPUBIC CATH (MISCELLANEOUS) IMPLANT
MANIFOLD NEPTUNE II (INSTRUMENTS) ×3 IMPLANT
NEEDLE HYPO 25X1 1.5 SAFETY (NEEDLE) IMPLANT
PACK CYSTO (CUSTOM PROCEDURE TRAY) ×3 IMPLANT
PENCIL BUTTON HOLSTER BLD 10FT (ELECTRODE) ×3 IMPLANT
SUT ETHILON 3 0 PS 1 (SUTURE) ×3 IMPLANT
SUT SILK 0 (SUTURE) ×1
SUT SILK 0 30XBRD TIE 6 (SUTURE) ×2 IMPLANT
SUT SILK 2 0 FS (SUTURE) IMPLANT
SYRINGE IRR TOOMEY STRL 70CC (SYRINGE) ×3 IMPLANT
TUBING CONNECTING 10 (TUBING) ×3 IMPLANT
WATER STERILE IRR 3000ML UROMA (IV SOLUTION) ×3 IMPLANT

## 2013-07-26 NOTE — Anesthesia Preprocedure Evaluation (Signed)
Anesthesia Evaluation  Patient identified by MRN, date of birth, ID band Patient awake    Reviewed: Allergy & Precautions, H&P , NPO status , Patient's Chart, lab work & pertinent test results  Airway       Dental  (+) Dental Advisory Given   Pulmonary shortness of breath and with exertion,          Cardiovascular negative cardio ROS      Neuro/Psych negative psych ROS   GI/Hepatic negative GI ROS, Neg liver ROS,   Endo/Other  negative endocrine ROS  Renal/GU negative Renal ROS     Musculoskeletal negative musculoskeletal ROS (+)   Abdominal   Peds  Hematology negative hematology ROS (+)   Anesthesia Other Findings   Reproductive/Obstetrics                           Anesthesia Physical Anesthesia Plan  ASA: III  Anesthesia Plan: MAC   Post-op Pain Management:    Induction: Intravenous  Airway Management Planned:   Additional Equipment:   Intra-op Plan:   Post-operative Plan:   Informed Consent: I have reviewed the patients History and Physical, chart, labs and discussed the procedure including the risks, benefits and alternatives for the proposed anesthesia with the patient or authorized representative who has indicated his/her understanding and acceptance.   Dental advisory given  Plan Discussed with: CRNA  Anesthesia Plan Comments:         Anesthesia Quick Evaluation

## 2013-07-26 NOTE — Progress Notes (Signed)
Pt arrived c restore on sacral area CDI

## 2013-07-26 NOTE — H&P (Signed)
[name] is an 57 y.o. female.    Chief Complaint: Pre-Op Cysto / Suprapubic Tube Placement  HPI:    1 - Total Incontinence / Neurogenic Bladder - Pt with MSA as per below and suspect neurognic bladder / bowel wtih complete incontinence. Facility has placed foley prn to help with chronic sacral wounds. No volitional voiding when catheter out. Likely areflexic by exam as bladder palpable to near umbilicus. NO abdominal surgeries.  2 - Recurrent Cystitis - Pt treated many times for suspect UTI. Occasional febrile episodes. Many UCX's in system with "many species".   PMH sig for multiple system atrophy (bed-bound, pressure sores, lives at [facility]), autonomic neuropathy / hypotension, VP Shunt. Her PCP is Dola Argyle. Her Neurologist is Dr. Rubin Payor at [facility].  Today [name] is seen to proceed with cysto and SP tube placement. Denies interval fevers.      Past Medical History  Diagnosis Date  . Multiple system atrophy     Followed  [facility] in movement disorder clinic-Dr Rubin Payor  . Osteoporosis   . Arthritis     Knees affected  . Osteoarthritis 06/24/2013  . Parkinson's disease 06/24/2013  . Neurogenic bladder 06/24/2013  . Orthostasis     "severe" per Dr. York Spaniel dictation  . Hx of hypokalemia   . Orthostatic syncope   . Shortness of breath     "mild dyspnea" per MD notes  . Urinary retention   . Dysuria   . Bladder incontinence   . Decubitus ulcers     sacral  . Autonomic neuropathy   . Bedridden     Past Surgical History  Procedure Laterality Date  . 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 Tonsillectomy    . Refractive surgery    . Neuroendoscopic placement / replacement ventricular catheter w/ attachment shunt / external drain      May 2012    Family History  Problem Relation Age of Onset  . Heart disease Mother   . Heart disease Father   . HIV Father   . Hyperlipidemia Brother    Social History:  reports that she has never smoked. She has never used  smokeless tobacco. She reports that she does not drink alcohol or use illicit drugs.  Allergies:  Allergies  Allergen Reactions  . Ibuprofen     Facial swelling  . Sulfa Antibiotics     Unknown - happened as a baby    No prescriptions prior to admission    No results found for this or any previous visit (from the past 48 hour(s)). No results found.  Review of Systems  Constitutional: Negative.  Negative for fever and chills.  HENT: Positive for neck pain.   Eyes: Positive for blurred vision.  Respiratory: Negative.   Cardiovascular: Negative.   Gastrointestinal: Negative.   Genitourinary: Negative.   Musculoskeletal: Positive for back pain.  Skin: Negative.   Neurological: Negative.   Endo/Heme/Allergies: Negative.   Psychiatric/Behavioral: Negative.     Height 5' (1.524 m), weight 38.556 kg (85 lb). Physical Exam  Constitutional:  Ill appearing with stigmata of chronic neurologic disease. Very pleasant.  HENT:  Head: Normocephalic and atraumatic.  Eyes: EOM are normal. Pupils are equal, round, and reactive to light.  Neck: Normal range of motion. Neck supple.  Respiratory: Effort normal.  GI: Soft. Bowel sounds are normal.  Very thin  Genitourinary: Vagina normal.  Musculoskeletal:  Significant LE and UE contractures  Neurological: She is alert.  Skin: Skin  is warm and dry.  Psychiatric: She has a normal mood and affect. Her behavior is normal. Judgment and thought content normal.     Assessment/Plan     1 - Total Incontinence / Neurogenic Bladder - Pt with likely neurogenic bladder based on clinical picture and known neurologic diagnoses. Also with chronic sacral wounds with poor healing from urine and fecal soiling.   Various management strategies were outlined again included intermittent cath (preferred for least risk of UTI in pts who have excellent dexterity and favorable body habitus) v. chronic indwelling catheters of various types (foley, SP tube) and  even forms of urinary diversion (typically reserved for young patients with extenuating circumstances) and their relative merits in their specific clinical situation.   After consideration of options, the patient has opted to proceed with trial of SP TUbe. Risks including bleeding, infection, damage to bowel / bladder discussed as well as rare risks such as CVA, MI, Mortality. Will perform today as scheduled. Can do under MAC / local if needed.  2 - Recurrent Cystitis - as per above, pt with chronic colonization that should NOT be treated unless febrile or acute change in medical status. Procedure today relatively low-risk as is low-pressure cystoscopy with peri-op ABX coverage.   Sanad Fearnow 07/26/2013, 6:29 AM

## 2013-07-26 NOTE — Progress Notes (Signed)
Last med dose times were given to me from [place] LPN at [facility]

## 2013-07-26 NOTE — Brief Op Note (Signed)
07/26/2013  1:21 PM  PATIENT:  [name]  57 y.o. female  PRE-OPERATIVE DIAGNOSIS:  Neurogenic Bladder  POST-OPERATIVE DIAGNOSIS:  neurogenic bladder  PROCEDURE:  Procedure(s): INSERTION OF SUPRAPUBIC CATHETER (N/A) CYSTOSCOPY WITH LITHOLAPAXY (N/A)  SURGEON:  Surgeon(s) and Role:    * Sebastian Ache, MD - Primary  PHYSICIAN ASSISTANT:   ASSISTANTS: none   ANESTHESIA:   general  EBL:     BLOOD ADMINISTERED:none  DRAINS: 40F silicone SP Tube to gravity drainage   LOCAL MEDICATIONS USED:  NONE  SPECIMEN:  No Specimen  DISPOSITION OF SPECIMEN:  N/A  COUNTS:  YES  TOURNIQUET:  * No tourniquets in log *  DICTATION: .Other Dictation: Dictation Number [id number]  PLAN OF CARE: Discharge to home after PACU  PATIENT DISPOSITION:  PACU - hemodynamically stable.   Delay start of Pharmacological VTE agent (>24hrs) due to surgical blood loss or risk of bleeding: not applicable

## 2013-07-27 NOTE — Op Note (Signed)
NAMECORISSA, OGUINN                  ACCOUNT NO.:  [id number]  MEDICAL RECORD NO.:  [id number]  LOCATION:  [facility]                         FACILITY:  [facility]  PHYSICIAN:  Sebastian Ache, MD     DATE OF BIRTH:  January 10, 1956  DATE OF PROCEDURE:  07/26/2013 DATE OF DISCHARGE:  07/26/2013                              OPERATIVE REPORT   PREOPERATIVE DIAGNOSIS:  Neurogenic bladder, history of multiple system atrophy.  POSTOPERATIVE DIAGNOSIS:  Neurogenic bladder, history of multiple system atrophy, plus large bladder stones.  PROCEDURE: 1. Cystoscopy, cystolitholapaxy stone <2.5cm. 2. Suprapubic tube placement, Lowsley technique.  COMPLICATIONS:  None.  SPECIMEN:  Bladder stones for discard.  ESTIMATED BLOOD LOSS:  Nil.  FINDINGS: 1. Large volume bladder stone approximately 2 cm2. 2. Successful placement of SP tube into the anterior wall of the     bladder.  INDICATION:  Ms. 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  She is completely incontinent of stool and urine, and has had problems of sacral decubitus, unable to further manage her urine.  Options were discussed including chronic Foley versus suprapubic tube versus continued incontinence, and she wished to proceed with SP tube.  Informed consent was obtained, and placed in the medical record.  PROCEDURE IN DETAIL:  The patient being Syriana Croslin was verified. Procedure being cystoscopy and suprapubic tube placement was confirmed. The procedure was carried out.  Time-out was performed.  Intravenous antibiotics were administered.  General LMA anesthesia was introduced. The patient was placed into a low lithotomy position.  Sterile field was created by prepping draping her infraumbilical abdomen, vagina, introitus, and proximal thighs using iodine x3.  Next, cystourethroscopy was performed using a 22-French rigid cystoscope with  12-degree offset lens.  Inspection of the bladder immediately revealed multifocal large volume bladder stone, approximately 3 cm2.  Photo documentation was performed using a flexible biopsy forceps.  These were sequentially crushed into smaller fragments, which were then irrigated.  The resectoscope sheath was then set aside for discard.  Repeat inspection revealed complete resolution of all bladder stones and no evidence of bladder perforation.  Then, the bladder was filled gently to cystometric capacity.  The Lowsley type introducer was carefully introduced by hugging the anterior wall, placed in superior traction, and counter incision was made in the midline between the pubic bone and the umbilicus, allowing Lowsley introduction of a 16-French silicone suprapubic tube in the urinary bladder.  Using cystoscopic vision, this was filled with 10 mL, verified adequate position.  The bladder was drained for SP tube, nylon drain stitch was applied.  Procedure was then terminated.  The patient tolerated the procedure well.  There were no immediate periprocedural complications. The patient was taken to postanesthesia care in stable condition.          ______________________________ Sebastian Ache, MD     TM/MEDQ  D:  07/26/2013  T:  07/26/2013  Job:  [id number]

## 2013-07-29 ENCOUNTER — Encounter (HOSPITAL_COMMUNITY): Payer: Self-pay | Admitting: Urology

## 2013-07-29 NOTE — Transfer of Care (Signed)
Immediate Anesthesia Transfer of Care Note  Patient: [name]  Procedure(s) Performed: Procedure(s): INSERTION OF SUPRAPUBIC CATHETER (N/A) CYSTOSCOPY WITH LITHOLAPAXY (N/A)  Patient Location: PACU  Anesthesia Type:General  Level of Consciousness: awake  Airway & Oxygen Therapy: Patient Spontanous Breathing and Patient connected to face mask oxygen  Post-op Assessment: Report given to PACU RN and Post -op Vital signs reviewed and stable  Post vital signs: Reviewed and stable  Complications: No apparent anesthesia complications

## 2013-08-15 ENCOUNTER — Encounter (HOSPITAL_COMMUNITY): Payer: Self-pay | Admitting: Urology

## 2013-08-15 NOTE — Anesthesia Postprocedure Evaluation (Signed)
Computer downtime. Documented on paper. Assisting Dr. Renold Don closing encounter.

## 2013-08-19 NOTE — Progress Notes (Signed)
This encounter was created in error - please disregard.

## 2013-08-20 ENCOUNTER — Non-Acute Institutional Stay: Payer: BC Managed Care – PPO | Admitting: Adult Health

## 2013-08-20 ENCOUNTER — Encounter: Payer: Self-pay | Admitting: Adult Health

## 2013-08-20 DIAGNOSIS — G20 Parkinson's disease: Secondary | ICD-10-CM

## 2013-08-20 DIAGNOSIS — N319 Neuromuscular dysfunction of bladder, unspecified: Secondary | ICD-10-CM

## 2013-08-20 DIAGNOSIS — E876 Hypokalemia: Secondary | ICD-10-CM

## 2013-08-20 DIAGNOSIS — I951 Orthostatic hypotension: Secondary | ICD-10-CM

## 2013-08-20 NOTE — Progress Notes (Signed)
Patient ID: [name], female   DOB: 12/31/1955, 57 y.o.   MRN: [id number]  [facility]  Allergies  Allergen Reactions  . Ibuprofen     Facial swelling  . Sulfa Antibiotics     Unknown - happened as a Advertising account executive Complaint  Patient presents with  . Medical Managment of Chronic Issues    HPI: Patient is followed for medical management for chronic illnesses.   Pt is currently enrolled under hospice care.  Further patient has suprapubic catheter in place. Pt denies change in muscle spasms.  Pt denies concerns or complaints at present. No further issues voiced by nursing at present.  Past Medical History  Diagnosis Date  . Multiple system atrophy     Followed  [facility] in movement disorder clinic-Dr Rubin Payor  . Osteoporosis   . Arthritis     Knees affected  . Osteoarthritis 06/24/2013  . Parkinson's disease 06/24/2013  . Neurogenic bladder 06/24/2013  . Orthostasis     "severe" per Dr. York Spaniel dictation  . Hx of hypokalemia   . Orthostatic syncope   . Shortness of breath     "mild dyspnea" per MD notes  . Urinary retention   . Dysuria   . Bladder incontinence   . Decubitus ulcers     sacral  . Autonomic neuropathy   . Bedridden     Past Surgical History  Procedure Laterality Date  . 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 Tonsillectomy    . Refractive surgery    . Neuroendoscopic placement / replacement ventricular catheter w/ attachment shunt / external drain      May 2012  . Insertion of suprapubic catheter N/A 07/26/2013    Procedure: INSERTION OF SUPRAPUBIC CATHETER;  Surgeon: Sebastian Ache, MD;  Location: [facility];  Service: Urology;  Laterality: N/A;  . Cystoscopy with litholapaxy N/A 07/26/2013    Procedure: CYSTOSCOPY WITH LITHOLAPAXY;  Surgeon: Sebastian Ache, MD;  Location: [facility];  Service: Urology;  Laterality: N/A;    VITAL SIGNS BP 91/67  Pulse 86  Ht 5\' 2"  (1.575 m)  Wt 82 lb 12.8 oz (37.558 kg)  BMI 15.14 kg/m2   Patient's Medications  New  Prescriptions   No medications on file  Previous Medications   ASPIRIN 81 MG CHEWABLE TABLET    Chew 81 mg by mouth at bedtime.    CARBIDOPA-LEVODOPA (SINEMET CR) 50-200 MG PER TABLET    Take 1 tablet by mouth 3 (three) times daily. 1 tab in AM , 1 tab in the afternoon , and 1 tablet at bedtime   CARBIDOPA-LEVODOPA (SINEMET IR) 25-100 MG PER TABLET    Take 1 tablet by mouth daily as needed (for tremors). Take in the evening   COLLAGENASE (SANTYL) OINTMENT    Apply 1 application topically daily. Cleanse sacrum stage 3 with normal saline, apply santyl with foam daily.   FLUDROCORTISONE (FLORINEF) 0.1 MG TABLET    Take 0.1 mg by mouth 3 (three) times daily. Hold for sbp greater than 110   MIDODRINE (PROAMATINE) 2.5 MG TABLET    Take 2.5 mg by mouth 2 (two) times daily. Hold for sbp greater than 130. Give with 2 cups of water.   MULTIPLE VITAMINS-MINERALS (CERTA-VITE PO)    Take 1 tablet by mouth daily.   POTASSIUM CHLORIDE SA (K-DUR,KLOR-CON) 20 MEQ TABLET    Take 20 mEq by mouth daily.   TRAMADOL (ULTRAM) 50 MG TABLET    Take  1 tablet (50 mg total) by mouth every 6 (six) hours as needed for pain.   ZINC OXIDE (BALMEX) 11.3 % CREA CREAM    Apply 1 application topically daily. Apply to buttocks daily for sacial pruritis  Modified Medications   No medications on file  Discontinued Medications   No medications on file    SIGNIFICANT DIAGNOSTIC EXAMS  06-25-13: ekg: sinus rhythm 07-01-13: carotid ultrasound: carotid artery bulb regions cannot be obtained secondary to the patients condition/body habitus. Otherwise no hemodynamically significant carotid artery stenosis suggested.   LABS REVIEWED:   06-02-13; urine culture enterococcus faecalis: ampicillin 06-21-13: glucose 81; bun 20; creat 0.6; k+ 3.6; na++ 143; liver normal albumin 3.8 Chol 188; ldl 121; trig 63 06-28-13: glucose 139; bun 20; creat 0.69; k+ 3.6;na++ 145 07-26-13: wbc 6.4; hgb 13.1; hct 38.7; mcv 89.8; plt 145; glucose 102; bun 15; creat  0.45; k+3.8; na++141   Review of Systems  HENT: Negative.  Negative for neck pain.   Eyes: Negative.        Reports h/o lasik surgery to both eyes   Respiratory: Negative for cough, shortness of breath and wheezing.   Cardiovascular: Negative for chest pain and palpitations.  Gastrointestinal: Positive for diarrhea.       Reports one episode  of diarrhea 8/25; further reports incontinent to stool  Genitourinary: Negative.  Negative for hematuria and flank pain.       Suprapubic catheter   Musculoskeletal: Negative for myalgias, back pain, joint pain and falls.       Pt reports global muscle spasms and generalized weakness. Denies falls in the past week  Skin: Negative for itching and rash.       Mild erythema surrounding insertion site of suprapubic catheter  Neurological: Positive for dizziness, tremors, focal weakness and weakness. Negative for loss of consciousness.       Reports h/o Parkinson's Dz  Endo/Heme/Allergies: Negative.   Psychiatric/Behavioral: Negative for depression and suicidal ideas. The patient does not have insomnia.        Reports feelings of intermittent sadness however endorses ability to cope well. Denies sleep disturbances    Physical Exam  Constitutional: She appears cachectic.  Cachetic  HENT:  Head: Normocephalic.  Mouth/Throat: Oropharynx is clear and moist.  Eyes: Conjunctivae and EOM are normal. Pupils are equal, round, and reactive to light.  Neck: Trachea normal. No JVD present. Muscular tenderness present. Decreased range of motion present. No tracheal deviation present. No mass and no thyromegaly present.  Cardiovascular: Normal rate, regular rhythm, S1 normal, S2 normal and normal heart sounds.   Pulses:      Radial pulses are 2+ on the right side, and 2+ on the left side.       Dorsalis pedis pulses are 1+ on the right side, and 1+ on the left side.  Respiratory: Breath sounds normal. No stridor. No respiratory distress. She has no wheezes.   GI: Soft. Bowel sounds are normal. There is no tenderness.  Musculoskeletal:       Right shoulder: She exhibits decreased range of motion and spasm.       Left shoulder: She exhibits decreased range of motion and spasm.       Right wrist: She exhibits decreased range of motion.       Left wrist: She exhibits decreased range of motion.       Right ankle: She exhibits decreased range of motion.       Left ankle: She exhibits decreased range of  motion.  Lymphadenopathy:    She has no cervical adenopathy.  Neurological: She is alert. She displays atrophy and tremor. GCS eye subscore is 4. GCS verbal subscore is 5. GCS motor subscore is 6.  Reflex Scores:      Patellar reflexes are 2+ on the right side and 2+ on the left side. Skin: Skin is warm and dry. There is erythema. No cyanosis.  Erythema at insertion site of suprapubic catheter, Well circumscribed nevus to LLE ( below the patella) approx. 3 cm in diameter  Psychiatric: Her speech is normal and behavior is normal. Judgment and thought content normal. Her affect is blunt.     ASSESSMENT/ PLAN:   Neurogenic bladder She has a suprapubic catheter in place.  Will continue to monitor output and s/s of infection.  Parkinson's disease Without change in status will continue sinemet 50/200 mg three times daily and 25/100 mg daily as needed and will continue to monitor will stop her mvi and zinc   Orthostatic hypotension Her blood pressure readings are highly variable at this time will continue midodrine at 2.5 mg twice daily will lower florinef to 0.1 mg twice daily will continue support hose daily;  Will have nursing monitor blood pressure prior to administration of Midodrine and Florinef.  Will continue to monitor hemodynamic status.  Hypokalemia Will continue k+ 20 meq daily

## 2013-10-01 ENCOUNTER — Non-Acute Institutional Stay (SKILLED_NURSING_FACILITY): Payer: Medicare Other | Admitting: Nurse Practitioner

## 2013-10-01 ENCOUNTER — Encounter: Payer: Self-pay | Admitting: Nurse Practitioner

## 2013-10-01 DIAGNOSIS — G909 Disorder of the autonomic nervous system, unspecified: Secondary | ICD-10-CM

## 2013-10-01 DIAGNOSIS — M542 Cervicalgia: Secondary | ICD-10-CM

## 2013-10-01 DIAGNOSIS — R531 Weakness: Secondary | ICD-10-CM

## 2013-10-01 DIAGNOSIS — R5381 Other malaise: Secondary | ICD-10-CM

## 2013-10-01 DIAGNOSIS — G238 Other specified degenerative diseases of basal ganglia: Secondary | ICD-10-CM

## 2013-10-22 NOTE — Progress Notes (Signed)
  10/01/2013  Patient:  [name] DOB:  Apr 29, 1956  Chief complaint: Complaint of head and neck pain The nursing staff reports concern of oversedation  History of present illness: The patient has multisystem atrophy. Has very little control over her extremities. She is not able to move her head or neck independently.   Physical examination: Patient laying in bed in no acute distress. She is alert, voice is very weak but certainly verbally appropriate. She is oriented x3. Extraocular movements are intact pupils are equally round reactive to light. Positive red reflex. Tympanic membranes unremarkable. Oropharynx teeth are in excellent repair no apparent lesions. Apical pulse with a regular rate and rhythm. Bilateral breath sounds fairly clear. Breath sounds are diminished. Abdomen, positive bowel sounds soft nondistended, staff reports bowel movement today. Her suprapubic catheter is draining clear yellow urine Bilateral lower extremities with significant muscle atrophy as anticipated. However they're warm and pink. Posterior tibial and pedal dorsalis pulses are easily appreciated.  Assessment/plan: Unfortunate patient with multisystem atrophy with head and neck pain. I have repositioned the patient's head and neck with appropriate pellets and support that her head is in alignment with her body. I will decrease her Neurontin from 300 mg 200 mg. I will ask for a cervical pillow. We'll consider drawing labs. Her labs in the past certainly been normal. No further interventions at this time.   This encounter was created in error - please disregard.

## 2013-10-28 ENCOUNTER — Other Ambulatory Visit: Payer: Self-pay | Admitting: *Deleted

## 2013-10-28 MED ORDER — LORAZEPAM 0.5 MG PO TABS: 0.5000 mg | ORAL_TABLET | Freq: Two times a day (BID) | ORAL | Status: DC | PRN

## 2013-11-14 NOTE — Progress Notes (Signed)
[name] CODE STATUS DO NOT RESUSCITATE  Patient ID: [name], female   DOB: 1956-05-26, 57 y.o.   MRN: [id number]  Allergies  Allergen Reactions  . Ibuprofen     Facial swelling  . Sulfa Antibiotics     Unknown - happened as a Probation officer Complaint  Patient presents with  . Acute Visit   HPI: Pt with long history of multisystem atrophy.  Staff with concern of over-sedation.  Pt with c/o head and neck pain.  She is unable to move her head and neck independently, she has very little control over her extremities.   Review of Systems  HENT: Negative for ear discharge and nosebleeds.   Eyes: Negative for discharge and redness.  Respiratory: Negative for hemoptysis and sputum production.   Gastrointestinal: Negative for vomiting and diarrhea.  Genitourinary: Negative for hematuria and flank pain.  Musculoskeletal: Positive for back pain and neck pain.  Neurological: Positive for weakness. Negative for seizures and loss of consciousness.  Psychiatric/Behavioral: Positive for depression. Negative for suicidal ideas and hallucinations.   Past Medical History  Diagnosis Date  . Multiple system atrophy     Followed  [facility] in movement disorder clinic-Dr Rubin Payor  . Osteoporosis   . Arthritis     Knees affected  . Osteoarthritis 06/24/2013  . Parkinson's disease 06/24/2013  . Neurogenic bladder 06/24/2013  . Orthostasis     "severe" per Dr. York Spaniel dictation  . Hx of hypokalemia   . Orthostatic syncope   . Shortness of breath     "mild dyspnea" per MD notes  . Urinary retention   . Dysuria   . Bladder incontinence   . Decubitus ulcers     sacral  . Autonomic neuropathy   . Bedridden   . Hospice care patient     active with [facility]-please call [phone] with any hospice needs, admissions, discharges   Past Surgical History  Procedure Laterality Date  . 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 Tonsillectomy    . Refractive surgery    . Neuroendoscopic  placement / replacement ventricular catheter w/ attachment shunt / external drain      May 2012  . Insertion of suprapubic catheter N/A 07/26/2013    Procedure: INSERTION OF SUPRAPUBIC CATHETER;  Surgeon: Sebastian Ache, MD;  Location: [facility];  Service: Urology;  Laterality: N/A;  . Cystoscopy with litholapaxy N/A 07/26/2013    Procedure: CYSTOSCOPY WITH LITHOLAPAXY;  Surgeon: Sebastian Ache, MD;  Location: [facility];  Service: Urology;  Laterality: N/A;   Medications ASA 81 mg Sinemet 25/100, 1 tab each evening Florinef 0.1 mg, 1 tab three times a day, hold if SBP >100 Midorine 2.5 mg, twice a day, hold if SBP  > 130 Multi-vitamin with minerals one per day KCl 20 meq per day  Lab from 09/18/2013  D5 0.2 RBC 4.2 hemoglobin 12.5 Hematocrit 39.3 MCV 93.6 MCH 29.8 MCHC 31 point RDW 12.3 Platelets 181  Sodium 141 Potassium 3.9 Chloride 106 CO2 29 AGAP 6 Glucose 92 BUN 29 Creatinine 0.5 Calcium 9.5  BP 96/58  Pulse 61  Resp 14  SpO2 95%  Physical examination:   Patient laying in bed in no acute distress. She is alert, voice is very weak but certainly verbally appropriate. She is oriented x3.   Extraocular movements are intact pupils are equally round reactive to light. Positive red reflex.   Tympanic membranes unremarkable.   Oropharynx teeth are in  excellent repair no apparent lesions.   Apical pulse with a regular rate and rhythm.   Bilateral breath sounds fairly clear. Breath sounds are diminished.   Abdomen, positive bowel sounds soft nondistended, staff reports bowel movement today.   Her suprapubic catheter is draining clear yellow urine   Bilateral lower extremities with significant muscle atrophy as anticipated. However they're warm and pink. Posterior tibial and pedal dorsalis pulses are easily appreciated.   Unable to move head or neck independently.  Minimal movement of lower extremities, sensation remains intact.  Assessment/plan:   Unfortunate  patient with multisystem atrophy with head and neck pain.  I have  repositioned the patient's head and neck with pillows so that her head is in alignment with her body. She will need a cervical pillow I will begin Acetaminophen 650 mg three times a day.  Will add tramadol or other analgesics as necessary.  Over sedation,  I will decrease her Neurontin from 300 mg to 200 mg, at 6 am, 2 pm, and 8 pm.   Orthostatic hypotension, continue Florinef 0.1 mg 3 times a day, holding if systolic blood pressures greater than 100, Midorine 2.5 mg twice a day, holding if systolic blood pressures greater than 130  Supplementation, will continue her multivitamin with minerals 1 each day and potassium at 20 mEq per day  We'll consider drawing labs. Her labs in the past certainly been normal.  No further interventions at this time.

## 2013-11-27 ENCOUNTER — Encounter (HOSPITAL_COMMUNITY): Payer: Self-pay | Admitting: Emergency Medicine

## 2013-11-27 ENCOUNTER — Other Ambulatory Visit: Payer: Self-pay | Admitting: *Deleted

## 2013-11-27 ENCOUNTER — Emergency Department (HOSPITAL_COMMUNITY)
Admission: EM | Admit: 2013-11-27 | Discharge: 2013-11-27 | Disposition: A | Discharge status: Skilled Nursing Facility | Payer: BC Managed Care – PPO | Attending: Emergency Medicine | Admitting: Emergency Medicine

## 2013-11-27 DIAGNOSIS — Z8669 Personal history of other diseases of the nervous system and sense organs: Secondary | ICD-10-CM | POA: Insufficient documentation

## 2013-11-27 DIAGNOSIS — T83010A Breakdown (mechanical) of cystostomy catheter, initial encounter: Secondary | ICD-10-CM

## 2013-11-27 DIAGNOSIS — Z79899 Other long term (current) drug therapy: Secondary | ICD-10-CM | POA: Insufficient documentation

## 2013-11-27 DIAGNOSIS — M810 Age-related osteoporosis without current pathological fracture: Secondary | ICD-10-CM | POA: Insufficient documentation

## 2013-11-27 DIAGNOSIS — Z8679 Personal history of other diseases of the circulatory system: Secondary | ICD-10-CM | POA: Insufficient documentation

## 2013-11-27 DIAGNOSIS — M1710 Unilateral primary osteoarthritis, unspecified knee: Secondary | ICD-10-CM | POA: Insufficient documentation

## 2013-11-27 DIAGNOSIS — Z7982 Long term (current) use of aspirin: Secondary | ICD-10-CM | POA: Insufficient documentation

## 2013-11-27 DIAGNOSIS — IMO0002 Reserved for concepts with insufficient information to code with codable children: Secondary | ICD-10-CM | POA: Insufficient documentation

## 2013-11-27 DIAGNOSIS — G20A1 Parkinson's disease without dyskinesia, without mention of fluctuations: Secondary | ICD-10-CM | POA: Insufficient documentation

## 2013-11-27 DIAGNOSIS — T83091A Other mechanical complication of indwelling urethral catheter, initial encounter: Secondary | ICD-10-CM | POA: Insufficient documentation

## 2013-11-27 DIAGNOSIS — Y846 Urinary catheterization as the cause of abnormal reaction of the patient, or of later complication, without mention of misadventure at the time of the procedure: Secondary | ICD-10-CM | POA: Insufficient documentation

## 2013-11-27 DIAGNOSIS — G20 Parkinson's disease: Secondary | ICD-10-CM | POA: Insufficient documentation

## 2013-11-27 DIAGNOSIS — Z872 Personal history of diseases of the skin and subcutaneous tissue: Secondary | ICD-10-CM | POA: Insufficient documentation

## 2013-11-27 DIAGNOSIS — Z7401 Bed confinement status: Secondary | ICD-10-CM | POA: Insufficient documentation

## 2013-11-27 HISTORY — DX: Encounter for palliative care: Z51.5

## 2013-11-27 MED ORDER — DIAZEPAM 2 MG PO TABS: ORAL_TABLET | ORAL | Status: DC

## 2013-11-27 NOTE — ED Notes (Signed)
Bed: WA17 Expected date:  Expected time:  Means of arrival:  Comments: 57 y/o catheter replacement

## 2013-11-27 NOTE — Progress Notes (Signed)
Room [facility] ED Rm 17 - Eloisa Northern - [facility] RN Visit-R.Jericha Bryden RN  Related ED visit to [facility] diagnosis of Parkinson's.  Pt is DNR code with OOF DNR on shadow chart.   Pt alert & oriented, lying on ED stretcher, without complaints of pain or discomfort now. Pt and husband state routine replacement of suprapubic catheter attempted without successful results.  Brought to ED for successful replacement. Per ED MD-45F suprapubic catheter connected to bag. No urine in bag or tubing. External appearance normal. No concerning skin changes, discharge or leaking of urine. Resistance with mild traction on tubing. No pain. Attempted to aspirate and met no resistance and no return of anything aside from air? Gently flushed sterile NS. After about 10cc pt reported that vagina felt wet. On closure inspection, the tip of the catheter was noted at vaginal introitus.   Area prepped in usual fashion. Sterile precautions taken. Balloon from existing cathter was deflated. Carefully removed with gentle traction. Passed easily and pt reported no pain. Clear urine noted from tract when removed. No blood or other concernng discharge. A new 45F catheter was placed after lubricating with Surgilube. Passed easily. Balloon inflated. Clear pale yellow urine flowing into tubing connected to bag. Pt reported that felt "much better." Abdomen remains soft and NT. No leakage noted from vagina. Catheter exchanged w/o apparent incident and pt tolerated well.   Discussed with Dr Retta Diones. Suspects they managed to pass the catheter through her urethra. Recommending pulling it back.  Cathter was replaced w/o apparent incident. Pt reported no pain with removal and it went smoothly. A new catheter was placed easily. Pt reported feeling better when new catheter placed and appears to be functioning properly. Pt denies continued vaginal leaking. None noted on re-exam or any blood or concerning findings. Abdomen remains  soft and nontender. Dr Emmaline Life op note was reviewed. Aside from bladder stones, cystoscopy seemed fairly normal. It appears that today the catheter was simply passed through urethra. No additional findings to support perforation or fistula. Will DC with continued routine catehter care and PRN urology follow-up. Emergent return precautions discussed.     Patient's home medication list is on shadow chart.   Pt to return to [facility] today.   Please call [facility] @ [phone]- ask for RN Liaison or after hours,ask for on-call RN with any hospice needs.   Thank you.  Joneen Boers, RN  [facility]  Hospice Liaison  [phone])

## 2013-11-27 NOTE — ED Provider Notes (Signed)
CSN: [id number]     Arrival date & time 11/27/13  5284 History   First MD Initiated Contact with Patient 11/27/13 0935     Chief Complaint  Patient presents with  . Vascular Access Problem   (Consider location/radiation/quality/duration/timing/severity/associated sxs/prior Treatment) HPI  57yF with suprapubic cathter problem. Hx of neurogenic bladder. Had suprapubic catheter placed 07/2013. Routine change today. No urine return. Sent to ED for further evaluation. Pt with no acute complaints. Hasn't had issues with catheter change previously.   Past Medical History  Diagnosis Date  . Multiple system atrophy     Followed  [facility] in movement disorder clinic-Dr Rubin Payor  . Osteoporosis   . Arthritis     Knees affected  . Osteoarthritis 06/24/2013  . Parkinson's disease 06/24/2013  . Neurogenic bladder 06/24/2013  . Orthostasis     "severe" per Dr. York Spaniel dictation  . Hx of hypokalemia   . Orthostatic syncope   . Shortness of breath     "mild dyspnea" per MD notes  . Urinary retention   . Dysuria   . Bladder incontinence   . Decubitus ulcers     sacral  . Autonomic neuropathy   . Bedridden    Past Surgical History  Procedure Laterality Date  . 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 Tonsillectomy    . Refractive surgery    . Neuroendoscopic placement / replacement ventricular catheter w/ attachment shunt / external drain      May 2012  . Insertion of suprapubic catheter N/A 07/26/2013    Procedure: INSERTION OF SUPRAPUBIC CATHETER;  Surgeon: Sebastian Ache, MD;  Location: [facility];  Service: Urology;  Laterality: N/A;  . Cystoscopy with litholapaxy N/A 07/26/2013    Procedure: CYSTOSCOPY WITH LITHOLAPAXY;  Surgeon: Sebastian Ache, MD;  Location: [facility];  Service: Urology;  Laterality: N/A;   Family History  Problem Relation Age of Onset  . Heart disease Mother   . Heart disease Father   . HIV Father   . Hyperlipidemia Brother    History  Substance Use Topics  . Smoking  status: Never Smoker   . Smokeless tobacco: Never Used  . Alcohol Use: No   OB History   Grav Para Term Preterm Abortions TAB SAB Ect Mult Living                 Review of Systems  All systems reviewed and negative, other than as noted in HPI.   Allergies  Ibuprofen and Sulfa antibiotics  Home Medications   Current Outpatient Rx  Name  Route  Sig  Dispense  Refill  . acetaminophen (TYLENOL) 325 MG tablet   Oral   Take 650 mg by mouth 3 (three) times daily.         Marland Kitchen amitriptyline (ELAVIL) 25 MG tablet   Oral   Take 25 mg by mouth every morning.         Marland Kitchen ARTIFICIAL TEAR SOLUTION OP   Both Eyes   Place 1 drop into both eyes 3 (three) times daily.         Marland Kitchen aspirin 81 MG chewable tablet   Oral   Chew 81 mg by mouth at bedtime.          . carbidopa-levodopa (SINEMET CR) 50-200 MG per tablet   Oral   Take 1 tablet by mouth 3 (three) times daily. 1 tab in AM , 1 tab in the afternoon , and 1 tablet at bedtime         .  carbidopa-levodopa (SINEMET IR) 25-100 MG per tablet   Oral   Take 1 tablet by mouth every evening. Take in the evening         . collagenase (SANTYL) ointment   Topical   Apply 1 application topically daily. Cleanse sacrum stage 3 with normal saline, apply santyl with foam daily.         . diazepam (VALIUM) 2 MG tablet   Oral   Take 2 mg by mouth 2 (two) times daily.         . fludrocortisone (FLORINEF) 0.1 MG tablet   Oral   Take 0.1 mg by mouth 2 (two) times daily. Hold for sbp greater than 110         . fluticasone (FLONASE) 50 MCG/ACT nasal spray   Each Nare   Place 2 sprays into both nostrils at bedtime.         . gabapentin (NEURONTIN) 100 MG capsule   Oral   Take 200 mg by mouth 2 (two) times daily.         Marland Kitchen lidocaine (XYLOCAINE) 2 % jelly   Topical   Apply 1 application topically every 6 (six) hours as needed (pain). Apply 1 inch strip around supra pubic         . loperamide (IMODIUM) 2 MG capsule    Oral   Take 2-4 mg by mouth daily as needed for diarrhea or loose stools. Take 2 capsules after 1st loose stool and 1 capsule for each loose stool thereafter.         . midodrine (PROAMATINE) 2.5 MG tablet   Oral   Take 2.5 mg by mouth 2 (two) times daily. Hold for sbp greater than 130. Give with 2 cups of water.         . potassium chloride (KLOR-CON) 20 MEQ packet   Oral   Take 20 mEq by mouth daily. Mix with applesauce         . sennosides-docusate sodium (SENOKOT-S) 8.6-50 MG tablet   Oral   Take 2 tablets by mouth at bedtime as needed for constipation. If no bowel movement for 3 days.         Marland Kitchen zinc oxide (BALMEX) 11.3 % CREA cream   Topical   Apply 1 application topically daily. Apply to buttocks daily for sacial pruritis          BP 141/101  Pulse 91  Temp(Src) 97.7 F (36.5 C) (Oral)  Resp 20  SpO2 100% Physical Exam  Nursing note and vitals reviewed. Constitutional: She appears well-developed and well-nourished. No distress.  HENT:  Head: Normocephalic and atraumatic.  Eyes: Conjunctivae are normal. Right eye exhibits no discharge. Left eye exhibits no discharge.  Neck: Neck supple.  Cardiovascular: Normal rate, regular rhythm and normal heart sounds.  Exam reveals no gallop and no friction rub.   No murmur heard. Pulmonary/Chest: Effort normal and breath sounds normal. No respiratory distress.  Abdominal: Soft. She exhibits no distension. There is no tenderness.  2F suprapubic catheter connected to bag. No urine in bag or tubing. External appearance normal. No concerning skin changes, discharge or leaking of urine. Resistance with mild traction on tubing. No pain. Attempted to aspirate and met no resistance and no return of anything aside from air? Gently flushed sterile NS. After about 10cc pt reported  that vagina felt wet. On closure inspection, the tip of the catheter was noted at vaginal introitus.   Musculoskeletal: She exhibits no edema and no  tenderness.  Neurological: She is alert.  Skin: Skin is warm and dry.  Psychiatric: She has a normal mood and affect. Her behavior is normal. Thought content normal.    ED Course  Procedures (including critical care time)  Suprapubic Catheter Replacement:  Area prepped in usual fashion. Sterile precautions taken. Balloon from existing cathter was deflated. Carefully removed with gentle traction. Passed easily and pt reported no pain. Clear urine noted from tract when removed. No blood or other concernng discharge. A new 17F catheter was placed after lubricating with Surgilube. Passed easily. Balloon inflated. Clear pale yellow urine flowing into tubing connected to bag. Pt reported that felt "much better." Abdomen remains soft and NT. No leakage noted from vagina. Catheter exchanged w/o apparent incident and pt tolerated well.     Labs Review Labs Reviewed - No data to display Imaging Review No results found.  EKG Interpretation   None       MDM   1. Suprapubic catheter dysfunction, initial encounter      Pt's catheter is inserted suprapubically but end is protruding through vagina. Urology paged.   Discussed with Dr Retta Diones. Suspects they managed to pass the catheter through her urethra. Recommending pulling it back.  Cathter was replaced w/o apparent incident. Pt reported no pain with removal and it went smoothly. A new catheter was placed easily. Pt reported feeling better when new catheter placed and appears to be functioning properly. Pt denies continued vaginal leaking. None noted on re-exam or any blood or concerning findings. Abdomen remains soft and nontender. Dr Emmaline Life op note was reviewed. Aside from bladder stones, cystoscopy seemed fairly normal. It appears that today the catheter was simply passed through urethra. No additional findings to support perforation or fistula. Will DC with continued routine catehter care and PRN urology follow-up. Emergent return  precautions discussed.   Raeford Razor, MD 11/27/13 [id number]

## 2013-11-27 NOTE — ED Notes (Signed)
Pt from [facility]. Pt was scheduled for monthly catheter replacement, unable to get suprapubic 16 French catheter back in.

## 2013-12-02 ENCOUNTER — Other Ambulatory Visit: Payer: Self-pay | Admitting: *Deleted

## 2013-12-02 MED ORDER — LORAZEPAM 0.5 MG PO TABS: ORAL_TABLET | ORAL | Status: DC

## 2013-12-02 MED ORDER — OXYCODONE HCL 5 MG PO CAPS: ORAL_CAPSULE | ORAL | Status: DC

## 2013-12-04 ENCOUNTER — Non-Acute Institutional Stay (SKILLED_NURSING_FACILITY): Payer: Medicare Other | Admitting: Nurse Practitioner

## 2013-12-04 DIAGNOSIS — G909 Disorder of the autonomic nervous system, unspecified: Secondary | ICD-10-CM

## 2013-12-04 DIAGNOSIS — F411 Generalized anxiety disorder: Secondary | ICD-10-CM

## 2013-12-04 DIAGNOSIS — G238 Other specified degenerative diseases of basal ganglia: Secondary | ICD-10-CM

## 2013-12-06 ENCOUNTER — Encounter: Payer: Self-pay | Admitting: Adult Health

## 2013-12-06 ENCOUNTER — Non-Acute Institutional Stay (SKILLED_NURSING_FACILITY): Payer: Medicare Other | Admitting: Adult Health

## 2013-12-06 DIAGNOSIS — G20 Parkinson's disease: Secondary | ICD-10-CM

## 2013-12-06 DIAGNOSIS — N319 Neuromuscular dysfunction of bladder, unspecified: Secondary | ICD-10-CM

## 2013-12-06 DIAGNOSIS — G238 Other specified degenerative diseases of basal ganglia: Secondary | ICD-10-CM

## 2013-12-06 NOTE — Progress Notes (Signed)
Patient ID: [name], female   DOB: Sep 10, 1956, 57 y.o.   MRN: [id number]     [facility]  Allergies  Allergen Reactions  . Ibuprofen     Facial swelling  . Sulfa Antibiotics     Unknown - happened as a Advertising account executive Complaint  Patient presents with  . Medical Managment of Chronic Issues    HPI:  Is being sen for the management of her chronic illnesses. She is followed by hospice care.  She is very weak and cannot fully participate in the hpi or ros due to her poor ability to verbalize. Her hair was washed with a different shampoo which has caused her some itching; she will need some benadryl to help with this.  She has had a significant decline since I have seen her last this past summer. She is much weaker and has much less ability to move her extremities.   Past Medical History  Diagnosis Date  . Multiple system atrophy     Followed  [facility] in movement disorder clinic-Dr Rubin Payor  . Osteoporosis   . Arthritis     Knees affected  . Osteoarthritis 06/24/2013  . Parkinson's disease 06/24/2013  . Neurogenic bladder 06/24/2013  . Orthostasis     "severe" per Dr. York Spaniel dictation  . Hx of hypokalemia   . Orthostatic syncope   . Shortness of breath     "mild dyspnea" per MD notes  . Urinary retention   . Dysuria   . Bladder incontinence   . Decubitus ulcers     sacral  . Autonomic neuropathy   . Bedridden   . Hospice care patient     active with [facility]-please call [phone] with any hospice needs, admissions, discharges    Past Surgical History  Procedure Laterality Date  . 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 Tonsillectomy    . Refractive surgery    . Neuroendoscopic placement / replacement ventricular catheter w/ attachment shunt / external drain      May 2012  . Insertion of suprapubic catheter N/A 07/26/2013    Procedure: INSERTION OF SUPRAPUBIC CATHETER;  Surgeon: Sebastian Ache, MD;  [facility];  Service: Urology;  Laterality: N/A;  . Cystoscopy  with litholapaxy N/A 07/26/2013    Procedure: CYSTOSCOPY WITH LITHOLAPAXY;  Surgeon: Sebastian Ache, MD;  [facility];  Service: Urology;  Laterality: N/A;    VITAL SIGNS BP 128/86  Pulse 80  Ht 5\' 1"  (1.549 m)  Wt 92 lb 12.8 oz (42.094 kg)  BMI 17.54 kg/m2   Patient's Medications  New Prescriptions   No medications on file  Previous Medications   ARTIFICIAL TEAR SOLUTION OP    Place 1 drop into both eyes 3 (three) times daily.   MORPHINE (ROXANOL) 20 MG/ML CONCENTRATED SOLUTION    Take 5 mg by mouth every 6 (six) hours.  Modified Medications   Modified Medication Previous Medication   LORAZEPAM (ATIVAN) 0.5 MG TABLET LORazepam (ATIVAN) 0.5 MG tablet      Take 0.5 mg by mouth every 6 (six) hours as needed. Take one tablet by mouth twice daily as needed for anxiety    Take one tablet by mouth twice daily as needed for anxiety  Discontinued Medications   ACETAMINOPHEN (TYLENOL) 325 MG TABLET    Take 650 mg by mouth 3 (three) times daily.   AMITRIPTYLINE (ELAVIL) 25 MG TABLET    Take 25 mg by mouth every morning.  ASPIRIN 81 MG CHEWABLE TABLET    Chew 81 mg by mouth at bedtime.    CARBIDOPA-LEVODOPA (SINEMET CR) 50-200 MG PER TABLET    Take 1 tablet by mouth 3 (three) times daily. 1 tab in AM , 1 tab in the afternoon , and 1 tablet at bedtime   CARBIDOPA-LEVODOPA (SINEMET IR) 25-100 MG PER TABLET    Take 1 tablet by mouth every evening. Take in the evening   COLLAGENASE (SANTYL) OINTMENT    Apply 1 application topically daily. Cleanse sacrum stage 3 with normal saline, apply santyl with foam daily.   DIAZEPAM (VALIUM) 2 MG TABLET    Take 1/2 tablet by mouth twice daily for anxiety/spasms   FLUDROCORTISONE (FLORINEF) 0.1 MG TABLET    Take 0.1 mg by mouth 2 (two) times daily. Hold for sbp greater than 110   FLUTICASONE (FLONASE) 50 MCG/ACT NASAL SPRAY    Place 2 sprays into both nostrils at bedtime.   GABAPENTIN (NEURONTIN) 100 MG CAPSULE    Take 200 mg by mouth 2 (two) times daily.    LIDOCAINE (XYLOCAINE) 2 % JELLY    Apply 1 application topically every 6 (six) hours as needed (pain). Apply 1 inch strip around supra pubic   LOPERAMIDE (IMODIUM) 2 MG CAPSULE    Take 2-4 mg by mouth daily as needed for diarrhea or loose stools. Take 2 capsules after 1st loose stool and 1 capsule for each loose stool thereafter.   MIDODRINE (PROAMATINE) 2.5 MG TABLET    Take 2.5 mg by mouth 2 (two) times daily. Hold for sbp greater than 130. Give with 2 cups of water.   OXYCODONE (OXY-IR) 5 MG CAPSULE    Take one tablet by mouth every 8 hours as needed for moderate to severe pain   POTASSIUM CHLORIDE (KLOR-CON) 20 MEQ PACKET    Take 20 mEq by mouth daily. Mix with applesauce   SENNOSIDES-DOCUSATE SODIUM (SENOKOT-S) 8.6-50 MG TABLET    Take 2 tablets by mouth at bedtime as needed for constipation. If no bowel movement for 3 days.   ZINC OXIDE (BALMEX) 11.3 % CREA CREAM    Apply 1 application topically daily. Apply to buttocks daily for sacial pruritis    SIGNIFICANT DIAGNOSTIC EXAMS   06-25-13: ekg: sinus rhythm 07-01-13: carotid ultrasound: carotid artery bulb regions cannot be obtained secondary to the patients condition/body habitus. Otherwise no hemodynamically significant carotid artery stenosis suggested.   LABS REVIEWED:   06-02-13; urine culture enterococcus faecalis: ampicillin 06-21-13: glucose 81; bun 20; creat 0.6; k+ 3.6; na++ 143; liver normal albumin 3.8 Chol 188; ldl 121; trig 63 06-28-13: glucose 139; bun 20; creat 0.69; k+ 3.6;na++ 145 07-26-13: wbc 6.4; hgb 13.1; hct 38.7; mcv 89.8; plt 145; glucose 102; bun 15; creat 0.45; k+3.8; na++141   Review of Systems  Skin: Positive for itching.       scalp    Physical Exam  Constitutional: No distress.  cathectic   Neck: Neck supple. No JVD present.  Cardiovascular: Normal rate, regular rhythm and intact distal pulses.   Respiratory: Effort normal and breath sounds normal. No respiratory distress.  GI: Soft. Bowel sounds are  normal.  Has s/p foley  Musculoskeletal: She exhibits no edema.  Has very little movement of extremities  Neurological: She is alert.  Skin: Skin is warm and dry. She is not diaphoretic.  No rash present on scalp ear lobes are slightly red and irritated     ASSESSMENT/PLAN  1. Multiple system atrophy: she  has declined her routine medications have been stopped. Will continue to focus her care upon comfort and will monitor her status. Will begin her on benadryl 25 mg every 6 hours as needed for itching.   2. Neurogenic bladder: has s/p foley will not make changes will monitor her status   3. Parkinson disease will continue to focus upon comfort and will monitor; will continue roxanol 5 mg every 6 hours for pain

## 2013-12-09 ENCOUNTER — Non-Acute Institutional Stay (SKILLED_NURSING_FACILITY): Payer: Medicare Other | Admitting: Adult Health

## 2013-12-09 ENCOUNTER — Other Ambulatory Visit: Payer: Self-pay | Admitting: *Deleted

## 2013-12-09 ENCOUNTER — Encounter: Payer: Self-pay | Admitting: Adult Health

## 2013-12-09 DIAGNOSIS — J309 Allergic rhinitis, unspecified: Secondary | ICD-10-CM

## 2013-12-09 DIAGNOSIS — G238 Other specified degenerative diseases of basal ganglia: Secondary | ICD-10-CM

## 2013-12-09 MED ORDER — MORPHINE SULFATE (CONCENTRATE) 20 MG/ML PO SOLN: ORAL | Status: AC

## 2013-12-09 MED ORDER — LORAZEPAM 0.5 MG PO TABS: ORAL_TABLET | ORAL | Status: AC

## 2013-12-09 NOTE — Progress Notes (Signed)
Patient ID: [name], female   DOB: January 29, 1956, 57 y.o.   MRN: [id number]     [name]  Allergies  Allergen Reactions  . Ibuprofen     Facial swelling  . Sulfa Antibiotics     Unknown - happened as a Probation officer Complaint  Patient presents with  . Acute Visit    pain managemnet     HPI: She is being seen for her pain management. She is not getting adequate pain relief with her current regimen of roxanol 5 mg every 6 hours. She is also having sinus congestion which is making is difficult for her to breath. I have spoken with her family who desires for her to be as comfortable as possible even if this means that she will sleep more.  She is having less movement; increased difficulty with speech; and cannot fully participate in the hpi or ros.   Past Medical History  Diagnosis Date  . Multiple system atrophy     Followed  [facility] in movement disorder clinic-Dr Rubin Payor  . Osteoporosis   . Arthritis     Knees affected  . Osteoarthritis 06/24/2013  . Parkinson's disease 06/24/2013  . Neurogenic bladder 06/24/2013  . Orthostasis     "severe" per Dr. York Spaniel dictation  . Hx of hypokalemia   . Orthostatic syncope   . Shortness of breath     "mild dyspnea" per MD notes  . Urinary retention   . Dysuria   . Bladder incontinence   . Decubitus ulcers     sacral  . Autonomic neuropathy   . Bedridden   . Hospice care patient     active with [facility]-please call [phone] with any hospice needs, admissions, discharges    Past Surgical History  Procedure Laterality Date  . 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 Tonsillectomy    . Refractive surgery    . Neuroendoscopic placement / replacement ventricular catheter w/ attachment shunt / external drain      May 2012  . Insertion of suprapubic catheter N/A 07/26/2013    Procedure: INSERTION OF SUPRAPUBIC CATHETER;  Surgeon: Sebastian Ache, MD;  [facility];  Service: Urology;  Laterality: N/A;  . Cystoscopy with  litholapaxy N/A 07/26/2013    Procedure: CYSTOSCOPY WITH LITHOLAPAXY;  Surgeon: Sebastian Ache, MD;  [facility]: [facility];  Service: Urology;  Laterality: N/A;    VITAL SIGNS BP 80/61  Pulse 94  Ht 5\' 1"  (1.549 m)  Wt 92 lb 12.8 oz (42.094 kg)  BMI 17.54 kg/m2   Patient's Medications  New Prescriptions   No medications on file  Previous Medications   ARTIFICIAL TEAR SOLUTION OP    Wilkins 1 drop into both eyes 3 (three) times daily.   LORAZEPAM (ATIVAN) 0.5 MG TABLET    Take one tablet by mouth every six hours   MORPHINE (ROXANOL) 20 MG/ML CONCENTRATED SOLUTION    Take 0.55ml every 6 hours   Modified Medications   No medications on file  Discontinued Medications   No medications on file    SIGNIFICANT DIAGNOSTIC EXAMS   06-25-13: ekg: sinus rhythm 07-01-13: carotid ultrasound: carotid artery bulb regions cannot be obtained secondary to the patients condition/body habitus. Otherwise no hemodynamically significant carotid artery stenosis suggested.   LABS REVIEWED:   06-02-13; urine culture enterococcus faecalis: ampicillin 06-21-13: glucose 81; bun 20; creat 0.6; k+ 3.6; na++ 143; liver normal albumin 3.8 Chol 188; ldl 121; trig 63 06-28-13: glucose  139; bun 20; creat 0.69; k+ 3.6;na++ 145 07-26-13: wbc 6.4; hgb 13.1; hct 38.7; mcv 89.8; plt 145; glucose 102; bun 15; creat 0.45; k+3.8; na++141  Review of Systems  Musculoskeletal:       Pain; sob    Physical Exam  Constitutional: No distress.  cathectic   Neck: Neck supple. No JVD present.  Cardiovascular: Normal rate, regular rhythm and intact distal pulses.   Respiratory: Effort normal and breath sounds normal. No respiratory distress.  GI: Soft. Bowel sounds are normal.  Has s/p foley  Musculoskeletal: She exhibits no edema.  Has very little movement of extremities  Neurological: She is alert.  Skin: Skin is warm and dry. She is not diaphoretic.      ASSESSMENT/PLAN  Multiple system atrophy: due to her discomfort; will  change her roxanol to 5 mg every 4 hours routinely and every hour as needed; will continue her ativan 0.5 mg every 6 hours routinely   Sinus congestion: will being claritin 1 mg daily and may use liquid formulation as needed to help her with swallowing.   Time spent with patient and family 40 minutes.

## 2013-12-13 NOTE — Progress Notes (Signed)
Date of Visit 12/04/2013 Code Status:  DNR  Patient ID: [name], female   DOB: [birth year], 57 y.o.   MRN: [id number]  Allergies  Allergen Reactions  . Ibuprofen     Facial swelling  . Sulfa Antibiotics     Unknown - happened as a Pension scheme manager Complaint  Patient presents with  . Acute Visit   HPI:   Pt is having more difficulty swallowing r/t her progressive Multiple System System Atrophy.   Is on multiple medications, likely of little benefit as pt is approaching end of life.  Discussed with Hospice Nurse r/t comfort measures only.  Review of Systems  HENT: Negative for ear discharge and nosebleeds.   Eyes: Negative for discharge and redness.  Respiratory: Negative for hemoptysis.        Shallow respirations  Gastrointestinal: Negative for diarrhea and blood in stool.  Genitourinary: Negative for hematuria and flank pain.       Patient with a suprapubic catheter  Musculoskeletal: Negative for falls.       Patient has generalized pain  Neurological: Positive for weakness.       Patient has minimal ability to move limbs  Endo/Heme/Allergies: Negative for polydipsia.  Psychiatric/Behavioral: Positive for depression. The patient is nervous/anxious.        Patient is very much aware that she is approaching end-of-life    Past Medical History  Diagnosis Date  . Multiple system atrophy     Followed  [facility] in movement disorder clinic-Dr Rubin Payor  . Osteoporosis   . Arthritis     Knees affected  . Osteoarthritis 06/24/2013  . Parkinson's disease 06/24/2013  . Neurogenic bladder 06/24/2013  . Orthostasis     "severe" per Dr. York Spaniel dictation  . Hx of hypokalemia   . Orthostatic syncope   . Shortness of breath     "mild dyspnea" per MD notes  . Urinary retention   . Dysuria   . Bladder incontinence   . Decubitus ulcers     sacral  . Autonomic neuropathy   . Bedridden   . Hospice care patient     active with [facility]-please call [phone] with any hospice needs,  admissions, discharges   Past Surgical History  Procedure Laterality Date  . 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 Tonsillectomy    . Refractive surgery    . Neuroendoscopic placement / replacement ventricular catheter w/ attachment shunt / external drain      May 2012  . Insertion of suprapubic catheter N/A 07/26/2013    Procedure: INSERTION OF SUPRAPUBIC CATHETER;  Surgeon: Sebastian Ache, MD;  Location: [facility];  Service: Urology;  Laterality: N/A;  . Cystoscopy with litholapaxy N/A 07/26/2013    Procedure: CYSTOSCOPY WITH LITHOLAPAXY;  Surgeon: Sebastian Ache, MD;  [facility]: [facility];  Service: Urology;  Laterality: N/A;   Medications: ASA 81 mg per day Sinemet,50/200 1 tab three times a day Sinemet 25/100 1 tab each p.m. Florinef 0.1 mg bid Midodrine 25 mg twice a day KCl 20 meq per day Tramadol 50 mg every six hours as needed for pain.  BP 164/113  Pulse 92  Resp 16  Physical Exam  Constitutional: She is oriented to person, place, and time.  Patient is a 57 year old Caucasian female who is laying in bed, being assisted by a friend, and drinking liquids. She is awake and conversant.  HENT:  Head: Normocephalic and atraumatic.  Right Ear: External ear normal.  Left Ear:  External ear normal.  Eyes: EOM are normal. Pupils are equal, round, and reactive to light. Right eye exhibits no discharge. Left eye exhibits no discharge. No scleral icterus.  Conjunctiva is pale  Neck: No thyromegaly present.  Cardiovascular: Normal rate, regular rhythm and intact distal pulses.   Pulmonary/Chest:  Patient's respirations are shallow, intermittently short periods of apnea, less than 10 seconds. No distinct adventitious sounds are appreciated.  Abdominal: Soft. Bowel sounds are normal.  Musculoskeletal:  Minimal lower extremity edema, in being below mid lower leg.  Lymphadenopathy:    She has no cervical adenopathy.  Neurological: She is alert and oriented to person, place, and time.    Patient with minimal voluntary movement of extremities. She is not able to change position or move her head without assistance.  Skin: Skin is warm and dry.  Psychiatric: She has a normal mood and affect. Thought content normal.  Patient's affect is appropriate for the situation.   Assessment/Plan:  Pt's multiple system atrophy is progressing and focus will be changed to comfort measures.   Will stop all routine medications.   Will begin Ativan, 0.5 mg by mouth twice a day.  The Ativan may be crushed and given with the Morphine.   Will begin Morphine, 20 mg per ml, 0.25 ml = 5mg , every 6 hours.   Will continue communication with the Hospice RN and adjust regimen as necessary.

## 2013-12-17 ENCOUNTER — Encounter: Payer: Self-pay | Admitting: Internal Medicine

## 2013-12-23 NOTE — Progress Notes (Signed)
     CODE STATUS DO NOT RESUSCITATE [name]  Patient ID: [name], female   DOB: August 22, 1956, 57 y.o.   MRN: [id number]  Chief Complaint  Patient presents with  . Acute Visit   HPI:   Patient is a 57 year old Caucasian female with a long history of multisystem atrophy. There has been a concern verbalized by the nursing staff of oversedation, question related to Neurontin dosing.  Review of Systems  HENT: Negative for ear discharge, ear pain and nosebleeds.   Eyes: Negative for pain, discharge and redness.  Respiratory: Negative for stridor.   Genitourinary:       Patient with suprapubic catheter, neurogenic bladder  Musculoskeletal: Positive for neck pain.  Neurological: Negative for seizures, loss of consciousness and weakness.  Psychiatric/Behavioral: Positive for depression. Negative for suicidal ideas and hallucinations.    Past Medical History  Diagnosis Date  . Multiple system atrophy     Followed  [facility] in movement disorder clinic-Dr Rubin Payor  . Osteoporosis   . Arthritis     Knees affected  . Osteoarthritis 06/24/2013  . Parkinson's disease 06/24/2013  . Neurogenic bladder 06/24/2013  . Orthostasis     "severe" per Dr. York Spaniel dictation  . Hx of hypokalemia   . Orthostatic syncope   . Shortness of breath     "mild dyspnea" per MD notes  . Urinary retention   . Dysuria   . Bladder incontinence   . Decubitus ulcers     sacral  . Autonomic neuropathy   . Bedridden   . Hospice care patient     active with [facility]-please call [phone] with any hospice needs, admissions, discharges   Past Surgical History  Procedure Laterality Date  . 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 Tonsillectomy    . Refractive surgery    . Neuroendoscopic placement / replacement ventricular catheter w/ attachment shunt / external drain      May 2012  . Insertion of suprapubic catheter N/A 07/26/2013    Procedure: INSERTION OF SUPRAPUBIC CATHETER;  Surgeon: Sebastian Ache, MD;   Location: [facility];  Service: Urology;  Laterality: N/A;  . Cystoscopy with litholapaxy N/A 07/26/2013    Procedure: CYSTOSCOPY WITH LITHOLAPAXY;  Surgeon: Sebastian Ache, MD;  Location: [facility];  Service: Urology;  Laterality: N/A;    This encounter was created in error - please disregard. This encounter was created in error - please disregard.

## 2013-12-26 DEATH — deceased
